# Patient Record
Sex: Male | Born: 1970
Health system: Southern US, Community
[De-identification: ages and names within clinical notes are randomized; demographics above are authoritative.]

## PROBLEM LIST (undated history)

## (undated) DIAGNOSIS — E785 Hyperlipidemia, unspecified: Secondary | ICD-10-CM

## (undated) DIAGNOSIS — E7800 Pure hypercholesterolemia, unspecified: Secondary | ICD-10-CM

## (undated) DIAGNOSIS — I10 Essential (primary) hypertension: Secondary | ICD-10-CM

## (undated) DIAGNOSIS — E119 Type 2 diabetes mellitus without complications: Secondary | ICD-10-CM

## (undated) HISTORY — DX: Hyperlipidemia, unspecified: E78.5

## (undated) HISTORY — DX: Type 2 diabetes mellitus without complications: E11.9

## (undated) HISTORY — PX: WISDOM TOOTH EXTRACTION: SHX21

---

## 2013-12-31 DIAGNOSIS — Z289 Immunization not carried out for unspecified reason: Secondary | ICD-10-CM | POA: Insufficient documentation

## 2013-12-31 DIAGNOSIS — E119 Type 2 diabetes mellitus without complications: Secondary | ICD-10-CM | POA: Insufficient documentation

## 2014-03-31 DIAGNOSIS — N529 Male erectile dysfunction, unspecified: Secondary | ICD-10-CM | POA: Insufficient documentation

## 2014-03-31 DIAGNOSIS — E3452 Partial androgen insensitivity syndrome: Secondary | ICD-10-CM | POA: Insufficient documentation

## 2014-03-31 DIAGNOSIS — Z79899 Other long term (current) drug therapy: Secondary | ICD-10-CM | POA: Insufficient documentation

## 2014-03-31 DIAGNOSIS — Z0000 Encounter for general adult medical examination without abnormal findings: Secondary | ICD-10-CM | POA: Insufficient documentation

## 2014-04-03 DIAGNOSIS — R6882 Decreased libido: Secondary | ICD-10-CM | POA: Insufficient documentation

## 2014-07-06 DIAGNOSIS — I861 Scrotal varices: Secondary | ICD-10-CM

## 2014-07-06 HISTORY — DX: Scrotal varices: I86.1

## 2015-02-11 LAB — HM HIV SCREENING LAB: HM HIV Screening: NEGATIVE

## 2016-08-03 DIAGNOSIS — Z2821 Immunization not carried out because of patient refusal: Secondary | ICD-10-CM | POA: Insufficient documentation

## 2018-08-29 ENCOUNTER — Encounter: Payer: Self-pay | Admitting: Family Medicine

## 2018-08-29 ENCOUNTER — Ambulatory Visit: Payer: 59 | Admitting: Family Medicine

## 2018-08-29 VITALS — BP 122/88 | HR 86 | Temp 98.2°F | Ht 72.5 in | Wt 206.0 lb

## 2018-08-29 DIAGNOSIS — M545 Low back pain: Secondary | ICD-10-CM

## 2018-08-29 DIAGNOSIS — E119 Type 2 diabetes mellitus without complications: Secondary | ICD-10-CM | POA: Diagnosis not present

## 2018-08-29 DIAGNOSIS — G8929 Other chronic pain: Secondary | ICD-10-CM | POA: Diagnosis not present

## 2018-08-29 LAB — HM HIV SCREENING LAB: HM HIV Screening: NEGATIVE

## 2018-08-29 MED ORDER — METFORMIN HCL 1000 MG PO TABS: 1000.0000 mg | ORAL_TABLET | Freq: Two times a day (BID) | ORAL | 3 refills | Status: DC

## 2018-08-29 MED ORDER — SITAGLIPTIN PHOSPHATE 50 MG PO TABS: 50.0000 mg | ORAL_TABLET | Freq: Every day | ORAL | 3 refills | Status: DC

## 2018-08-29 NOTE — Progress Notes (Signed)
Subjective:    Patient ID: Spencer Barber, male    DOB: 1971/10/08, 47 y.o.   MRN: [id number]  HPI   Patient presents to clinic to establish primary care with new PCP.  He recently moved to [place] from the [place] area.  He is a diabetic, had been taking Janumet XR however his insurance will no longer refill this. He also is taking Jardiance, but thinks this medication makes him urinate too much - when he does not take, he urinates normally. His most recent A1c in June 2019 was 6.5%.  Patient checks his blood sugar usually once per day, tends to run no higher than 150.   Also c/o low back aching by the end of the day. 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 He also does a lot of work outdoors at home. He states by the end of his day he is tired due to aching and has to sit or lay down.   Past Medical History:  Diagnosis Date  . Diabetes mellitus without complication (HCC)    Social History   Tobacco Use  . Smoking status: Never Smoker  . Smokeless tobacco: Never Used  Substance Use Topics  . Alcohol use: Yes   History reviewed. No pertinent surgical history.   Family History  Problem Relation Age of Onset  . Diabetes Mother   . Alcohol abuse Father    Review of Systems  Constitutional: Negative for chills, fatigue and fever.  HENT: Negative for congestion, ear pain, sinus pain and sore throat.   Eyes: Negative.   Respiratory: Negative for cough, shortness of breath and wheezing.   Cardiovascular: Negative for chest pain, palpitations and leg swelling.  Gastrointestinal: Negative for abdominal pain, diarrhea, nausea and vomiting.  Genitourinary: Negative for dysuria, frequency and urgency.  Musculoskeletal: Negative for arthralgias and myalgias.  Skin: Negative for color change, pallor and rash.  Neurological: Negative for syncope, light-headedness and headaches.  Psychiatric/Behavioral: The  patient is not nervous/anxious.       Objective:   Physical Exam  Constitutional: He appears well-developed and well-nourished. No distress.  HENT:  Head: Normocephalic and atraumatic.  Eyes: Pupils are equal, round, and reactive to light. Conjunctivae and EOM are normal. No scleral icterus.  Neck: Normal range of motion. Neck supple. No tracheal deviation present.  Cardiovascular: Normal rate, regular rhythm and normal heart sounds.  Pulmonary/Chest: Effort normal and breath sounds normal. No respiratory distress. He has no wheezes. He has no rales.  Abdominal: Soft. Bowel sounds are normal. There is no tenderness.  Neurological: He is alert and oriented to person, place, and time.  Musculoskeletal: ROM all extremities & spine intact. Gait normal. Skin: Skin is warm and dry. He is not diaphoretic. No pallor.  Psychiatric: He has a normal mood and affect. His behavior is normal. Thought content normal.   Nursing note and vitals reviewed.    Vitals:   08/29/18 1557  BP: 122/88  Pulse: 86  Temp: 98.2 F (36.8 C)  SpO2: 97%   Assessment & Plan:    Type 2 diabetes - we will break up patient's Janumet combination medicine into Januvia 50 mg once daily and metformin 1000 mg twice daily, the shorter release versions of these medications should be covered by insurance and be less expensive than the Janumet XR combination.  Patient feels that [place] Pepper is making him urinate too much, and prefer not to take it.  Patient will stop Jardiance and take only metformin and Januvia.  We will check his A1c in December 2019, he has been doing A1c's every 6 months so we will keep him on the schedule.  If his A1c remains stable in December which is the metformin and Januvia he will continue to not take Jardiance.  Low back pain - suspect pain in the low back is related to osteoarthritis.  Patient declines x-ray at this time.  Advised to try taking the Aleve to 20 mg 1-2 times per day to help improve  pain.  Lab work to be drawn in December 2019.  If labs remain stable and A1c is unchanged we will continue his current medication regimen and plan to have him follow-up in June 2020 so he can stay on his every six-month schedule as he has been doing at his past PCP.

## 2018-08-30 ENCOUNTER — Encounter: Payer: Self-pay | Admitting: Family Medicine

## 2018-08-30 DIAGNOSIS — M545 Low back pain, unspecified: Secondary | ICD-10-CM | POA: Insufficient documentation

## 2018-08-30 DIAGNOSIS — E119 Type 2 diabetes mellitus without complications: Secondary | ICD-10-CM | POA: Insufficient documentation

## 2018-08-30 DIAGNOSIS — G8929 Other chronic pain: Secondary | ICD-10-CM | POA: Insufficient documentation

## 2018-09-02 ENCOUNTER — Telehealth: Payer: Self-pay

## 2018-09-02 NOTE — Telephone Encounter (Signed)
Copied from CRM [id number]. Topic: Quick Communication - See Telephone Encounter >> Sep 02, 2018  4:45 PM Herby Abraham C wrote: CRM for notification. See Telephone encounter for: 09/02/18.  Pt came in and was seen. Pt was suppose to receive a 90 day supply on both medications prescribed. Pt says that he only received partial Rx for metFORMIN (GLUCOPHAGE) 1000 MG tablet and didn't receive Rx for sitaGLIPtin (JANUVIA) 50 MG tablet at all. Pt would like assistance with this further.

## 2018-09-03 NOTE — Telephone Encounter (Signed)
Called Pt to confirm with him that the Rx for Metformin 180 tabs was sent to [facility] also the Januvia 90 tabs was sent also. Told the Pt that he should ask the pharmacy why they didn't give him all of his Rx.

## 2018-09-05 ENCOUNTER — Other Ambulatory Visit: Payer: Self-pay | Admitting: Family Medicine

## 2018-09-05 DIAGNOSIS — E119 Type 2 diabetes mellitus without complications: Secondary | ICD-10-CM

## 2018-09-05 MED ORDER — LINAGLIPTIN 5 MG PO TABS: 5.0000 mg | ORAL_TABLET | Freq: Every day | ORAL | 3 refills | Status: DC

## 2018-09-06 NOTE — Progress Notes (Signed)
Called Pt to tell him of the Rx sent to the pharmacy for him.

## 2018-11-04 ENCOUNTER — Other Ambulatory Visit (INDEPENDENT_AMBULATORY_CARE_PROVIDER_SITE_OTHER): Payer: 59

## 2018-11-04 DIAGNOSIS — E119 Type 2 diabetes mellitus without complications: Secondary | ICD-10-CM

## 2018-11-05 ENCOUNTER — Other Ambulatory Visit: Payer: 59

## 2018-11-05 DIAGNOSIS — E119 Type 2 diabetes mellitus without complications: Secondary | ICD-10-CM

## 2018-11-05 LAB — MICROALBUMIN / CREATININE URINE RATIO
CREATININE, U: 60.8 mg/dL
Microalb Creat Ratio: 1.2 mg/g (ref 0.0–30.0)

## 2018-11-05 LAB — COMPREHENSIVE METABOLIC PANEL
ALK PHOS: 152 U/L — AB (ref 39–117)
ALT: 17 U/L (ref 0–53)
AST: 16 U/L (ref 0–37)
Albumin: 4.3 g/dL (ref 3.5–5.2)
BILIRUBIN TOTAL: 0.5 mg/dL (ref 0.2–1.2)
BUN: 12 mg/dL (ref 6–23)
CO2: 29 mEq/L (ref 19–32)
CREATININE: 1.05 mg/dL (ref 0.40–1.50)
Calcium: 10 mg/dL (ref 8.4–10.5)
Chloride: 98 mEq/L (ref 96–112)
GFR: 80.1 mL/min (ref >60.00)
GLUCOSE: 344 mg/dL — AB (ref 70–99)
Potassium: 4.2 mEq/L (ref 3.5–5.1)
Sodium: 134 mEq/L — ABNORMAL LOW (ref 135–145)
TOTAL PROTEIN: 7.1 g/dL (ref 6.0–8.3)

## 2018-11-05 LAB — CBC
HCT: 42.6 % (ref 39.0–52.0)
Hemoglobin: 14.4 g/dL (ref 13.0–17.0)
MCHC: 33.8 g/dL (ref 30.0–36.0)
MCV: 84.9 fl (ref 78.0–100.0)
PLATELETS: 202 10*3/uL (ref 150.0–400.0)
RBC: 5.02 Mil/uL (ref 4.22–5.81)
RDW: 13.9 % (ref 11.5–15.5)
WBC: 5 10*3/uL (ref 4.0–10.5)

## 2018-11-06 LAB — HEMOGLOBIN A1C
HEMOGLOBIN A1C: 10.8 %{Hb} — AB (ref <5.7)
Mean Plasma Glucose: 263 (calc)
eAG (mmol/L): 14.6 (calc)

## 2018-11-07 ENCOUNTER — Encounter: Payer: Self-pay | Admitting: Family Medicine

## 2018-11-07 DIAGNOSIS — E119 Type 2 diabetes mellitus without complications: Secondary | ICD-10-CM

## 2018-11-12 ENCOUNTER — Other Ambulatory Visit: Payer: 59

## 2018-11-12 ENCOUNTER — Ambulatory Visit: Payer: 59 | Admitting: Family Medicine

## 2018-11-12 MED ORDER — DULAGLUTIDE 0.75 MG/0.5ML ~~LOC~~ SOAJ: 0.7500 mg | SUBCUTANEOUS | 5 refills | Status: DC

## 2019-06-01 ENCOUNTER — Other Ambulatory Visit: Payer: Self-pay | Admitting: Family Medicine

## 2019-06-01 DIAGNOSIS — E119 Type 2 diabetes mellitus without complications: Secondary | ICD-10-CM

## 2019-07-01 ENCOUNTER — Other Ambulatory Visit: Payer: Self-pay | Admitting: Lab

## 2019-07-08 ENCOUNTER — Encounter: Payer: Self-pay | Admitting: Family Medicine

## 2019-07-08 DIAGNOSIS — E119 Type 2 diabetes mellitus without complications: Secondary | ICD-10-CM

## 2019-07-09 MED ORDER — METFORMIN HCL 1000 MG PO TABS: 1000.0000 mg | ORAL_TABLET | Freq: Two times a day (BID) | ORAL | 0 refills | Status: DC

## 2019-07-09 MED ORDER — LINAGLIPTIN 5 MG PO TABS: 5.0000 mg | ORAL_TABLET | Freq: Every day | ORAL | 0 refills | Status: DC

## 2019-07-09 NOTE — Telephone Encounter (Signed)
1 month supply of the metformin and Tradjenta sent into the pharmacy.  Patient really should be following up every 6 months at minimum due to his diabetes; we cannot effectively manage him without proper follow-up to be sure he is on the proper medications and to help [place] best monitor his blood work so we can be sure all electrolytes, liver, kidney functions are remaining stable.

## 2019-07-10 ENCOUNTER — Encounter: Payer: Self-pay | Admitting: Family Medicine

## 2019-07-10 ENCOUNTER — Other Ambulatory Visit: Payer: Self-pay

## 2019-07-10 ENCOUNTER — Ambulatory Visit (INDEPENDENT_AMBULATORY_CARE_PROVIDER_SITE_OTHER): Payer: 59 | Admitting: Family Medicine

## 2019-07-10 VITALS — Ht 72.5 in | Wt 206.0 lb

## 2019-07-10 DIAGNOSIS — E119 Type 2 diabetes mellitus without complications: Secondary | ICD-10-CM | POA: Diagnosis not present

## 2019-07-10 NOTE — Progress Notes (Signed)
Patient ID: Spencer AustinCamara Duty, male   DOB: [birth year], 48 y.o.   MRN: [id number]    Virtual Visit via phone Note  This visit type was conducted due to national recommendations for restrictions regarding the COVID-19 pandemic (e.g. social distancing).  This format is felt to be most appropriate for this patient at this time.  All issues noted in this document were discussed and addressed.  No physical exam was performed (except for noted visual exam findings with Video Visits).   I connected with Spencer Barber today at  4:00 PM EDT by a video enabled telemedicine application or telephone and verified that I am speaking with the correct person using two identifiers. Location patient: home Location provider: work or home office Persons participating in the virtual visit: patient, provider  I discussed the limitations, risks, security and privacy concerns of performing an evaluation and management service by telephone and the availability of in person appointments. I also discussed with the patient that there may be a patient responsible charge related to this service. The patient expressed understanding and agreed to proceed.     HPI:  Patient and I connected via phone to follow up on type 2 diabetes.  He has not followed up recently and last A1c is from December 2019  Lab Results  Component Value Date   HGBA1C 10.8 (H) 11/05/2018   Patient has been taking metformin, Jardiance and Trulicity.  Seems to be tolerating these medicines without any adverse effects.  Denies nausea, vomiting, diarrhea or any feelings of hypoglycemia.  States he has not checked his blood sugar recently due to being busy at home with his family, have been drawn on hour to get to his job.  Patient does have a glucose monitor at home.  Denies chest pain, shortness of breath, cough, palpitations, lower extremity swelling.  Denies fever or chills.   ROS: See pertinent positives and negatives per HPI.  Past Medical  History:  Diagnosis Date  . Diabetes mellitus without complication (HCC)     No past surgical history on file.  Family History  Problem Relation Age of Onset  . Diabetes Mother   . Alcohol abuse Father    Social History   Tobacco Use  . Smoking status: Never Smoker  . Smokeless tobacco: Never Used  Substance Use Topics  . Alcohol use: Yes    Current Outpatient Medications:  .  linagliptin (TRADJENTA) 5 MG TABS tablet, Take 1 tablet (5 mg total) by mouth daily., Disp: 30 tablet, Rfl: 0 .  metFORMIN (GLUCOPHAGE) 1000 MG tablet, Take 1 tablet (1,000 mg total) by mouth 2 (two) times daily with a meal., Disp: 60 tablet, Rfl: 0 .  TRULICITY 0.75 MG/0.5ML SOPN, INJECT 0.75 MG SUBCUTANEOUSLY ONCE A WEEK, Disp: 4 mL, Rfl: 0  EXAM:  GENERAL: alert, oriented, sounds well and in no acute distress  LUNGS: Speaking in full sentences, no signs of respiratory distress, breathing rate appears normal, no obvious gross SOB, gasping or wheezing  PSYCH/NEURO: pleasant and cooperative, no obvious depression or anxiety, speech and thought processing grossly intact  ASSESSMENT AND PLAN:  Discussed the following assessment and plan:  Type 2 diabetes-long discussion with patient regards to importance of checking blood sugars at least a few times per week so we have a baseline and know where sugar readings are standing.  Advised that diabetes can cause multiple chronic medical issues over time including vascular disease, increased risk for cancers, increased risk for stroke and heart attack, and even death.  Advised patient that oftentimes our blood sugars can be running higher and we would never have any symptoms, that is why we must check blood sugar so we know where her reading is.  Patient is agreeable to get blood work, will be unable to come into office to get blood work due to his work schedule however patient agrees to go to the hospital outpatient lab to have his blood work drawn.  Lab orders  placed.  Also discussed with patient that he really needs to be getting lab work and having a visit at minimum every 6 months for continued monitoring of his diabetes, we cannot successfully treat and monitor his diabetes without proper lab work and proper follow-up.  For now he will continue metformin, Jardiance and Trulicity and we will determine if any medication changes are needed based on lab work.   I discussed the assessment and treatment plan with the patient. The patient was provided an opportunity to ask questions and all were answered. The patient agreed with the plan and demonstrated an understanding of the instructions.   The patient was advised to call back or seek an in-person evaluation if the symptoms worsen or if the condition fails to improve as anticipated.  I provided 25 minutes of non-face-to-face time during this encounter.   Jodelle Green, FNP

## 2019-07-21 ENCOUNTER — Emergency Department
Admission: EM | Admit: 2019-07-21 | Discharge: 2019-07-21 | Disposition: A | Discharge status: Home / Self Care | Payer: 59

## 2019-07-21 ENCOUNTER — Other Ambulatory Visit
Admission: RE | Admit: 2019-07-21 | Discharge: 2019-07-21 | Disposition: A | Discharge status: Home / Self Care | Payer: 59 | Source: Ambulatory Visit | Attending: Family Medicine | Admitting: Family Medicine

## 2019-07-21 DIAGNOSIS — E119 Type 2 diabetes mellitus without complications: Secondary | ICD-10-CM | POA: Diagnosis present

## 2019-07-21 LAB — COMPREHENSIVE METABOLIC PANEL
ALT: 20 U/L (ref 0–44)
AST: 21 U/L (ref 15–41)
Albumin: 4.5 g/dL (ref 3.5–5.0)
Alkaline Phosphatase: 79 U/L (ref 38–126)
Anion gap: 9 (ref 5–15)
BUN: 18 mg/dL (ref 6–20)
CO2: 26 mmol/L (ref 22–32)
Calcium: 9.8 mg/dL (ref 8.9–10.3)
Chloride: 104 mmol/L (ref 98–111)
Creatinine, Ser: 0.89 mg/dL (ref 0.61–1.24)
GFR calc Af Amer: >60 mL/min (ref >60)
GFR calc non Af Amer: >60 mL/min (ref >60)
Glucose, Bld: 164 mg/dL — ABNORMAL HIGH (ref 70–99)
Potassium: 4.1 mmol/L (ref 3.5–5.1)
Sodium: 139 mmol/L (ref 135–145)
Total Bilirubin: 0.8 mg/dL (ref 0.3–1.2)
Total Protein: 7.3 g/dL (ref 6.5–8.1)

## 2019-07-21 LAB — CBC WITH DIFFERENTIAL/PLATELET
Abs Immature Granulocytes: 0.02 10*3/uL (ref 0.00–0.07)
Basophils Absolute: 0 10*3/uL (ref 0.0–0.1)
Basophils Relative: 1 %
Eosinophils Absolute: 0.1 10*3/uL (ref 0.0–0.5)
Eosinophils Relative: 1 %
HCT: 40.2 % (ref 39.0–52.0)
Hemoglobin: 13.8 g/dL (ref 13.0–17.0)
Immature Granulocytes: 1 %
Lymphocytes Relative: 35 %
Lymphs Abs: 1.5 10*3/uL (ref 0.7–4.0)
MCH: 28.4 pg (ref 26.0–34.0)
MCHC: 34.3 g/dL (ref 30.0–36.0)
MCV: 82.7 fL (ref 80.0–100.0)
Monocytes Absolute: 0.6 10*3/uL (ref 0.1–1.0)
Monocytes Relative: 13 %
Neutro Abs: 2.2 10*3/uL (ref 1.7–7.7)
Neutrophils Relative %: 49 %
Platelets: 198 10*3/uL (ref 150–400)
RBC: 4.86 MIL/uL (ref 4.22–5.81)
RDW: 12.4 % (ref 11.5–15.5)
WBC: 4.4 10*3/uL (ref 4.0–10.5)
nRBC: 0 % (ref 0.0–0.2)

## 2019-07-21 LAB — LIPID PANEL
Cholesterol: 176 mg/dL (ref 0–200)
HDL: 54 mg/dL (ref >40)
LDL Cholesterol: 99 mg/dL (ref 0–99)
Total CHOL/HDL Ratio: 3.3 RATIO
Triglycerides: 113 mg/dL (ref <150)
VLDL: 23 mg/dL (ref 0–40)

## 2019-07-21 LAB — TSH: TSH: 0.365 u[IU]/mL (ref 0.350–4.500)

## 2019-07-21 NOTE — ED Notes (Signed)
Pt here only for outpatient labs.  Not for ED visit.

## 2019-07-23 LAB — HEMOGLOBIN A1C
Hgb A1c MFr Bld: 7.5 % — ABNORMAL HIGH (ref 4.8–5.6)
Mean Plasma Glucose: 169 mg/dL

## 2019-08-20 ENCOUNTER — Other Ambulatory Visit: Payer: Self-pay | Admitting: Family Medicine

## 2019-08-20 ENCOUNTER — Encounter: Payer: Self-pay | Admitting: Family Medicine

## 2019-08-20 DIAGNOSIS — E119 Type 2 diabetes mellitus without complications: Secondary | ICD-10-CM

## 2019-08-20 MED ORDER — LINAGLIPTIN 5 MG PO TABS: 5.0000 mg | ORAL_TABLET | Freq: Every day | ORAL | 0 refills | Status: DC

## 2019-08-20 MED ORDER — METFORMIN HCL 1000 MG PO TABS: 1000.0000 mg | ORAL_TABLET | Freq: Two times a day (BID) | ORAL | 0 refills | Status: DC

## 2019-08-20 NOTE — Telephone Encounter (Signed)
Lauren, see messages. He seems to be having an issue with medication refills. I resumed his metformin and refilled his tradjenta. He is to see you in December for diabetes follow up, wasn't sure if you wanted to resume his Trulicity now or wait until then.  Allie Bossier, NP-C

## 2019-08-21 MED ORDER — TRULICITY 0.75 MG/0.5ML ~~LOC~~ SOAJ: SUBCUTANEOUS | 0 refills | Status: DC

## 2019-08-25 ENCOUNTER — Telehealth: Payer: Self-pay

## 2019-08-25 ENCOUNTER — Other Ambulatory Visit: Payer: Self-pay | Admitting: Family Medicine

## 2019-08-25 DIAGNOSIS — E119 Type 2 diabetes mellitus without complications: Secondary | ICD-10-CM

## 2019-08-25 MED ORDER — TRULICITY 0.75 MG/0.5ML ~~LOC~~ SOAJ: SUBCUTANEOUS | 2 refills | Status: DC

## 2019-08-25 NOTE — Telephone Encounter (Signed)
Copied from [place] [id number]. Topic: General - Other >> Aug 25, 2019  8:42 AM Jodie Echevaria wrote: Reason for CRM: [facility] called to say that patient insurance require prior authorization for her to get Dulaglutide (TRULICITY) 3.64 WO/0.3OZ SOPN paperwork sent over from Pharmacy needed ASAP please. Please advise

## 2019-08-25 NOTE — Telephone Encounter (Signed)
Please let patient know trulicity sent into pharmacy  Thanks  LG

## 2019-08-25 NOTE — Telephone Encounter (Signed)
Called Pt he was at work, I spoke to Pt wife and told her the Pt Rx was sent into [facility].

## 2019-09-18 ENCOUNTER — Telehealth: Payer: Self-pay

## 2019-09-18 NOTE — Telephone Encounter (Signed)
Copied from [place] [id number]. Topic: Quick Sport and exercise psychologist Patient (Clinic Use ONLY) >> Sep 18, 2019  4:32 PM Pauline Good wrote: Reason for CRM: pt was very upset because his appt was being move and he stated he work out of town and he won't get off early to come to a doctors appt. Pt need something around 4:30, tried to give pt appt but he declined them all. Ptease call pt to sched for medication refill

## 2019-09-19 NOTE — Telephone Encounter (Signed)
Called and scheduled pt for 11/05/19 @4pm 

## 2019-09-19 NOTE — Telephone Encounter (Signed)
When I called and spoke to pt about rescheduling his appt. Pt stated we needed to accommodate him with a 4:30 appt time. I told him that we do not have 4:30 appt slots. He stated that we were denying his health care. I told him that I could get him in at 4pm he asked what the late policy was I told him 70HEKB after appt time. He said that he will probaly have to change appt

## 2019-10-13 ENCOUNTER — Other Ambulatory Visit: Payer: Self-pay

## 2019-10-13 DIAGNOSIS — Z20822 Contact with and (suspected) exposure to covid-19: Secondary | ICD-10-CM

## 2019-10-14 ENCOUNTER — Encounter: Payer: Self-pay | Admitting: Family

## 2019-10-14 ENCOUNTER — Telehealth: Payer: Self-pay

## 2019-10-14 LAB — NOVEL CORONAVIRUS, NAA: SARS-CoV-2, NAA: DETECTED — AB

## 2019-10-14 NOTE — Telephone Encounter (Signed)
Received call from patient checking Covid results.  Advised results are positive.  Advised to contact PCP or visit ED if symptoms worsen.   

## 2019-10-28 ENCOUNTER — Ambulatory Visit: Payer: 59 | Admitting: Family Medicine

## 2019-11-05 ENCOUNTER — Encounter: Payer: Self-pay | Admitting: Family

## 2019-11-05 ENCOUNTER — Other Ambulatory Visit: Payer: Self-pay

## 2019-11-05 ENCOUNTER — Ambulatory Visit (INDEPENDENT_AMBULATORY_CARE_PROVIDER_SITE_OTHER): Payer: 59 | Admitting: Family

## 2019-11-05 VITALS — Ht 72.5 in | Wt 206.0 lb

## 2019-11-05 DIAGNOSIS — E119 Type 2 diabetes mellitus without complications: Secondary | ICD-10-CM | POA: Diagnosis not present

## 2019-11-05 DIAGNOSIS — Z1211 Encounter for screening for malignant neoplasm of colon: Secondary | ICD-10-CM

## 2019-11-05 NOTE — Assessment & Plan Note (Signed)
Largely controlled. Pending a1c.  Advised pneumococcal 23 vaccine at local pharmacy due work schedule ( he needs late appointment).

## 2019-11-05 NOTE — Progress Notes (Signed)
Virtual Visit via Video Note  I connected with@  on 11/05/19 at  4:00 PM EST by a video enabled telemedicine application and verified that I am speaking with the correct person using two identifiers.  Location patient: home Location provider:work  Persons participating in the virtual visit: patient, provider  I discussed the limitations of evaluation and management by telemedicine and the availability of in person appointments. The patient expressed understanding and agreed to proceed.   HPI: Establish care Feels well today.  Primary concern is getting up-to-date on preventive care when having labs drawn. He is interested in having a  colonoscopy. Great-grandmother he believes had colon cancer. He would like to screen for prostate cancer with PSA.  He denies any urinary hesitancy, decreased flow.  DM- Last a1c 7.6; compliant with medicaition  Due for Tdap and pneumonia.   ROS: See pertinent positives and negatives per HPI.  Past Medical History:  Diagnosis Date  . Diabetes mellitus without complication ([place])     History reviewed. No pertinent surgical history.  Family History  Problem Relation Age of Onset  . Diabetes Mother   . Alcohol abuse Father   . Colon cancer Maternal Great-grandmother     SOCIAL HX: never smoker   Current Outpatient Medications:  .  Dulaglutide (TRULICITY) 6.76 PP/5.0DT SOPN, Inject 0.75 mg subcutaneously once a week., Disp: 4 mL, Rfl: 2 .  linagliptin (TRADJENTA) 5 MG TABS tablet, Take 1 tablet (5 mg total) by mouth daily. For diabetes., Disp: 90 tablet, Rfl: 0 .  metFORMIN (GLUCOPHAGE) 1000 MG tablet, Take 1 tablet (1,000 mg total) by mouth 2 (two) times daily with a meal. For diabetes, Disp: 180 tablet, Rfl: 0  EXAM:  VITALS per patient if applicable:  GENERAL: alert, oriented, appears well and in no acute distress  HEENT: atraumatic, conjunttiva clear, no obvious abnormalities on inspection of external nose and ears  NECK: normal  movements of the head and neck  LUNGS: on inspection no signs of respiratory distress, breathing rate appears normal, no obvious gross SOB, gasping or wheezing  CV: no obvious cyanosis  MS: moves all visible extremities without noticeable abnormality  PSYCH/NEURO: pleasant and cooperative, no obvious depression or anxiety, speech and thought processing grossly intact  ASSESSMENT AND PLAN:  Discussed the following assessment and plan:  Type 2 diabetes mellitus without complication, without long-term current use of insulin (HCC) - Plan: Hemoglobin A1c, Lipid panel-future, VITAMIN D 25 Hydroxy (Vit-D Deficiency, Fractures), PSA-PSA-  AA start at 3, White 49 yo, Microalbumin / creatinine urine ratio  Screen for colon cancer - Plan: Colonoscopy-Ambulatory referral to Gastroenterology-45 to 75 years Problem List Items Addressed This Visit      Endocrine   Type 2 diabetes mellitus without complication, without long-term current use of insulin ([place]) - Primary    Largely controlled. Pending a1c.  Advised pneumococcal 23 vaccine at local pharmacy due work schedule ( he needs late appointment).      Relevant Orders   Hemoglobin A1c   Lipid panel-future   VITAMIN D 25 Hydroxy (Vit-D Deficiency, Fractures)   PSA-PSA-  AA start at 49, White 48 yo   Microalbumin / creatinine urine ratio    Other Visit Diagnoses    Screen for colon cancer       Relevant Orders   Colonoscopy-Ambulatory referral to Gastroenterology-45 to 75 years      -we discussed possible serious and likely etiologies, options for evaluation and workup, limitations of telemedicine visit vs in person visit, treatment,  treatment risks and precautions. Pt prefers to treat via telemedicine empirically rather then risking or undertaking an in person visit at this moment. Patient agrees to seek prompt in person care if worsening, new symptoms arise, or if is not improving with treatment.   I discussed the assessment and treatment  plan with the patient. The patient was provided an opportunity to ask questions and all were answered. The patient agreed with the plan and demonstrated an understanding of the instructions.   The patient was advised to call back or seek an in-person evaluation if the symptoms worsen or if the condition fails to improve as anticipated.   Rennie Plowman, FNP

## 2019-11-06 ENCOUNTER — Telehealth: Payer: Self-pay | Admitting: Family Medicine

## 2019-11-06 NOTE — Telephone Encounter (Signed)
Lm on vm to call and schedule cpe at the end of Feb or March.

## 2019-11-16 ENCOUNTER — Other Ambulatory Visit
Admission: RE | Admit: 2019-11-16 | Discharge: 2019-11-16 | Disposition: A | Discharge status: Home / Self Care | Payer: 59 | Attending: Family | Admitting: Family

## 2019-11-16 DIAGNOSIS — E119 Type 2 diabetes mellitus without complications: Secondary | ICD-10-CM | POA: Diagnosis not present

## 2019-11-16 LAB — LIPID PANEL
Cholesterol: 160 mg/dL (ref 0–200)
HDL: 42 mg/dL (ref >40)
LDL Cholesterol: 100 mg/dL — ABNORMAL HIGH (ref 0–99)
Total CHOL/HDL Ratio: 3.8 RATIO
Triglycerides: 92 mg/dL (ref <150)
VLDL: 18 mg/dL (ref 0–40)

## 2019-11-16 LAB — VITAMIN D 25 HYDROXY (VIT D DEFICIENCY, FRACTURES): Vit D, 25-Hydroxy: 67.16 ng/mL (ref 30–100)

## 2019-11-16 LAB — PSA: Prostatic Specific Antigen: 0.76 ng/mL (ref 0.00–4.00)

## 2019-11-16 LAB — HEMOGLOBIN A1C
Hgb A1c MFr Bld: 6.4 % — ABNORMAL HIGH (ref 4.8–5.6)
Mean Plasma Glucose: 136.98 mg/dL

## 2019-11-17 LAB — MICROALBUMIN / CREATININE URINE RATIO
Creatinine, Urine: 81.7 mg/dL
Microalb Creat Ratio: 20 mg/g creat (ref 0–29)
Microalb, Ur: 16.1 ug/mL — ABNORMAL HIGH

## 2019-11-19 ENCOUNTER — Other Ambulatory Visit: Payer: Self-pay

## 2019-11-19 DIAGNOSIS — E119 Type 2 diabetes mellitus without complications: Secondary | ICD-10-CM

## 2019-11-19 MED ORDER — METFORMIN HCL 1000 MG PO TABS: 1000.0000 mg | ORAL_TABLET | Freq: Two times a day (BID) | ORAL | 0 refills | Status: DC

## 2019-11-19 MED ORDER — ROSUVASTATIN CALCIUM 10 MG PO TABS: 10.0000 mg | ORAL_TABLET | Freq: Every day | ORAL | 3 refills | Status: DC

## 2019-11-21 ENCOUNTER — Encounter: Payer: Self-pay | Admitting: Family

## 2019-11-21 ENCOUNTER — Other Ambulatory Visit: Payer: Self-pay | Admitting: Family

## 2019-11-21 ENCOUNTER — Encounter: Payer: Self-pay | Admitting: *Deleted

## 2019-11-21 DIAGNOSIS — E119 Type 2 diabetes mellitus without complications: Secondary | ICD-10-CM

## 2019-11-21 DIAGNOSIS — R809 Proteinuria, unspecified: Secondary | ICD-10-CM

## 2019-11-21 MED ORDER — LISINOPRIL 5 MG PO TABS: 2.5000 mg | ORAL_TABLET | Freq: Every day | ORAL | 3 refills | Status: DC

## 2019-11-24 ENCOUNTER — Telehealth: Payer: Self-pay | Admitting: Family

## 2019-11-24 NOTE — Chronic Care Management (AMB) (Signed)
  Care Management   Outreach Note  11/24/2019 Name: Spencer Barber MRN: [id number] DOB: April 26, 1971  Referred by: Allegra Grana, FNP Reason for referral :  Care Management (CM Initial outreach unsuccessful)   An unsuccessful telephone outreach was attempted today. The patient was referred to the case management team by for assistance with care management and care coordination.   Follow Up Plan: A HIPPA compliant phone message was left for the patient providing contact information and requesting a return call.  The care management team will reach out to the patient again over the next 7 days.  If patient returns call to provider office, please advise to call Embedded Care Management Care Guide Elisha Ponder LPN at [phone]  Elisha Ponder, LPN Health Advisor, Embedded Care Coordination [facility] ??[email or web address] ??[phone]

## 2019-11-24 NOTE — Chronic Care Management (AMB) (Signed)
  Care Management   Note  11/24/2019 Name: Spencer Barber MRN: [id number] DOB: 06/23/1971  Spencer Barber is a 49 y.o. year old male who is a primary care patient of Allegra Grana, FNP. I reached out to Darcus Austin by phone today in response to a referral sent by Spencer Barber's health plan.    Spencer Barber was given information about care management services today including:  1. Care management services include personalized support from designated clinical staff supervised by his physician, including individualized plan of care and coordination with other care providers 2. 24/7 contact phone numbers for assistance for urgent and routine care needs. 3. The patient may stop care management services at any time by phone call to the office staff.  Patient agreed to services and verbal consent obtained.   Follow up plan: Telephone appointment with care management team member scheduled for:01/01/2020  Spencer Ponder, LPN Health Advisor, Embedded Care Coordination Spencer Barber Health Care Management ??[email or web address] ??[phone]

## 2019-11-29 ENCOUNTER — Other Ambulatory Visit
Admission: RE | Admit: 2019-11-29 | Discharge: 2019-11-29 | Disposition: A | Discharge status: Home / Self Care | Payer: 59 | Source: Ambulatory Visit | Attending: Family | Admitting: Family

## 2019-11-29 ENCOUNTER — Encounter: Payer: Self-pay | Admitting: Family

## 2019-11-29 ENCOUNTER — Other Ambulatory Visit: Payer: Self-pay

## 2019-12-01 ENCOUNTER — Other Ambulatory Visit: Payer: Self-pay | Admitting: Family

## 2019-12-01 DIAGNOSIS — R899 Unspecified abnormal finding in specimens from other organs, systems and tissues: Secondary | ICD-10-CM

## 2019-12-01 DIAGNOSIS — E119 Type 2 diabetes mellitus without complications: Secondary | ICD-10-CM

## 2019-12-03 ENCOUNTER — Other Ambulatory Visit: Payer: Self-pay

## 2019-12-03 ENCOUNTER — Other Ambulatory Visit
Admission: RE | Admit: 2019-12-03 | Discharge: 2019-12-03 | Disposition: A | Discharge status: Home / Self Care | Payer: 59 | Attending: Family | Admitting: Family

## 2019-12-03 DIAGNOSIS — E119 Type 2 diabetes mellitus without complications: Secondary | ICD-10-CM | POA: Diagnosis not present

## 2019-12-03 LAB — BASIC METABOLIC PANEL
Anion gap: 15 (ref 5–15)
BUN: 13 mg/dL (ref 6–20)
CO2: 26 mmol/L (ref 22–32)
Calcium: 10.4 mg/dL — ABNORMAL HIGH (ref 8.9–10.3)
Chloride: 102 mmol/L (ref 98–111)
Creatinine, Ser: 0.95 mg/dL (ref 0.61–1.24)
GFR calc Af Amer: >60 mL/min (ref >60)
GFR calc non Af Amer: >60 mL/min (ref >60)
Glucose, Bld: 108 mg/dL — ABNORMAL HIGH (ref 70–99)
Potassium: 4.3 mmol/L (ref 3.5–5.1)
Sodium: 143 mmol/L (ref 135–145)

## 2019-12-08 NOTE — Addendum Note (Signed)
Addended by: Allegra Grana on: 12/08/2019 10:11 AM   Modules accepted: Orders

## 2020-01-01 ENCOUNTER — Telehealth: Payer: 59

## 2020-01-18 ENCOUNTER — Other Ambulatory Visit: Payer: Self-pay | Admitting: Primary Care

## 2020-01-18 DIAGNOSIS — E119 Type 2 diabetes mellitus without complications: Secondary | ICD-10-CM

## 2020-01-20 NOTE — Telephone Encounter (Signed)
Last prescribed by Spencer Reel, NP on 08/20/2019 in absence for Spencer Cover, FNP. Patient have transfer care to Spencer Plowman, FNP.  Please advise

## 2020-02-07 ENCOUNTER — Ambulatory Visit: Payer: 59 | Attending: Internal Medicine

## 2020-02-07 DIAGNOSIS — Z23 Encounter for immunization: Secondary | ICD-10-CM

## 2020-02-07 NOTE — Progress Notes (Signed)
   Covid-19 Vaccination Clinic  Name:  Juergen Hardenbrook    MRN: [id number] DOB: 10/04/1971  02/07/2020  Mr. Beckner was observed post Covid-19 immunization for 15 minutes without incident. He was provided with Vaccine Information Sheet and instruction to access the V-Safe system.   Mr. Gaughan was instructed to call 911 with any severe reactions post vaccine: Marland Kitchen Difficulty breathing  . Swelling of face and throat  . A fast heartbeat  . A bad rash all over body  . Dizziness and weakness   Immunizations Administered    Name Date Dose VIS Date Route   Pfizer COVID-19 Vaccine 02/07/2020  6:30 PM 0.3 mL 10/24/2019 Intramuscular   Manufacturer: [employer], [employer]   Lot: [id number]   NDC: [address]-8

## 2020-02-15 ENCOUNTER — Other Ambulatory Visit: Payer: Self-pay | Admitting: Primary Care

## 2020-02-15 DIAGNOSIS — E119 Type 2 diabetes mellitus without complications: Secondary | ICD-10-CM

## 2020-02-17 NOTE — Telephone Encounter (Signed)
I called patient & he was busy at work. He stated that the pneumovax 23 was not a priority right now, but would like a physical. He did not know due to work & his anniversary in May what day would work. He said that he would call or mychart back to let us know dates when he could be scheduled.

## 2020-02-17 NOTE — Telephone Encounter (Signed)
Call pt He is due for f/u sch in office so he can pneumovax 23 at that time Ensure that his visit is a month out from any covid vaccines so we can give pneumovax

## 2020-02-17 NOTE — Telephone Encounter (Signed)
This refill came to [facility] refill

## 2020-02-29 ENCOUNTER — Ambulatory Visit: Payer: 59 | Attending: Internal Medicine

## 2020-02-29 DIAGNOSIS — Z23 Encounter for immunization: Secondary | ICD-10-CM

## 2020-02-29 NOTE — Progress Notes (Signed)
   Covid-19 Vaccination Clinic  Name:  Spencer Barber    MRN: [id number] DOB: 21-Dec-1970  02/29/2020  Spencer Barber was observed post Covid-19 immunization for 15 minutes without incident. He was provided with Vaccine Information Sheet and instruction to access the V-Safe system.   Spencer Barber was instructed to call 911 with any severe reactions post vaccine: Marland Kitchen Difficulty breathing  . Swelling of face and throat  . A fast heartbeat  . A bad rash all over body  . Dizziness and weakness   Immunizations Administered    Name Date Dose VIS Date Route   Pfizer COVID-19 Vaccine 02/29/2020  3:17 PM 0.3 mL 10/24/2019 Intramuscular   Manufacturer: [employer], [employer]   Lot: [id number]   NDC: [address]-8

## 2020-03-29 ENCOUNTER — Other Ambulatory Visit: Payer: Self-pay

## 2020-03-29 DIAGNOSIS — E119 Type 2 diabetes mellitus without complications: Secondary | ICD-10-CM

## 2020-03-29 MED ORDER — TRULICITY 0.75 MG/0.5ML ~~LOC~~ SOAJ: SUBCUTANEOUS | 0 refills | Status: DC

## 2020-05-25 ENCOUNTER — Other Ambulatory Visit: Payer: Self-pay | Admitting: Family

## 2020-05-25 DIAGNOSIS — E119 Type 2 diabetes mellitus without complications: Secondary | ICD-10-CM

## 2020-06-15 ENCOUNTER — Other Ambulatory Visit: Payer: Self-pay | Admitting: Family

## 2020-06-15 DIAGNOSIS — E119 Type 2 diabetes mellitus without complications: Secondary | ICD-10-CM

## 2020-07-05 ENCOUNTER — Telehealth: Payer: Self-pay | Admitting: Family

## 2020-07-05 NOTE — Telephone Encounter (Signed)
Call pt He is due for DM f/u Please confirm he has and is taking all medications on his chart

## 2020-07-06 NOTE — Telephone Encounter (Signed)
LM asking patient to call back so I could go over meds & get him scheduled for f/u.

## 2020-07-13 ENCOUNTER — Other Ambulatory Visit: Payer: Self-pay

## 2020-07-13 ENCOUNTER — Encounter: Payer: Self-pay | Admitting: Family

## 2020-07-13 DIAGNOSIS — E119 Type 2 diabetes mellitus without complications: Secondary | ICD-10-CM

## 2020-07-13 MED ORDER — LINAGLIPTIN 5 MG PO TABS: ORAL_TABLET | ORAL | 1 refills | Status: DC

## 2020-07-21 ENCOUNTER — Other Ambulatory Visit: Payer: Self-pay | Admitting: Family

## 2020-07-21 DIAGNOSIS — E119 Type 2 diabetes mellitus without complications: Secondary | ICD-10-CM

## 2020-07-22 MED ORDER — TRULICITY 0.75 MG/0.5ML ~~LOC~~ SOAJ: SUBCUTANEOUS | 0 refills | Status: DC

## 2020-07-25 ENCOUNTER — Encounter: Payer: Self-pay | Admitting: Family

## 2020-07-26 ENCOUNTER — Other Ambulatory Visit: Payer: Self-pay

## 2020-07-26 ENCOUNTER — Telehealth: Payer: Self-pay

## 2020-07-26 NOTE — Telephone Encounter (Signed)
Please take a look at pictures & see my responses to [employer].

## 2020-07-26 NOTE — Telephone Encounter (Signed)
I spoke with patient & he is scheduled here with Fransico Setters, NP on Friday. Patient stated he had no fever, SOB, heat/warmth to leg. He stated that he was playing with his son & he felt something pull. He was in pain, but kept going. Then his wife noticed the big bruise on the back of his leg on Sunday. He was advised that since Friday was so far out that if he had any new or worsening symptoms to please go to UC or ED. I also moved physical up tp next week the 22nd.

## 2020-07-26 NOTE — Telephone Encounter (Signed)
Close  

## 2020-07-28 ENCOUNTER — Other Ambulatory Visit: Payer: Self-pay

## 2020-07-30 ENCOUNTER — Encounter: Payer: Self-pay | Admitting: Nurse Practitioner

## 2020-07-30 ENCOUNTER — Ambulatory Visit: Payer: BC Managed Care – PPO | Admitting: Nurse Practitioner

## 2020-07-30 ENCOUNTER — Other Ambulatory Visit: Payer: Self-pay

## 2020-07-30 VITALS — BP 120/78 | HR 92 | Temp 98.6°F | Ht 73.0 in | Wt 213.0 lb

## 2020-07-30 DIAGNOSIS — S76301A Unspecified injury of muscle, fascia and tendon of the posterior muscle group at thigh level, right thigh, initial encounter: Secondary | ICD-10-CM | POA: Diagnosis not present

## 2020-07-30 DIAGNOSIS — S76309A Unspecified injury of muscle, fascia and tendon of the posterior muscle group at thigh level, unspecified thigh, initial encounter: Secondary | ICD-10-CM | POA: Insufficient documentation

## 2020-07-30 NOTE — Patient Instructions (Addendum)
Suspect a hamstring injury from fast running onset 2 weeks ago.It is improving but still having pain.  May apply ice, rest and compression with an ACE wrap may be helpful, take Tylenol as directed.   Please see [facility] ASAP  and use the [facility]ALK IN for further evaluation and discussion about MRI of the thigh. They are open now and can see you now.    Hamstring Strain  A hamstring strain happens when the muscles in the back of the thighs (hamstring muscles) are overstretched or torn. The hamstring muscles are used in straightening the hips, bending the knees, and pulling back the legs. This injury is often called a pulled hamstring muscle. The tissue that connects the muscle to a bone (tendon) may also be affected. The severity of a hamstring strain may be rated in degrees or grades. First-degree (or grade 1) strains have the least amount of muscle tearing and pain. Second-degree and third-degree (grade 2 and 3) strains have increasingly more tearing and pain. What are the causes? This condition is caused by a sudden, violent force being placed on the hamstring muscles, stretching them too far. This often happens during activities that involve running, jumping, kicking, or weight lifting. What increases the risk? Hamstring strains are especially common in athletes. The following factors may also make you more likely to develop this condition:  Having low strength, endurance, or flexibility of the hamstring muscles.  Doing high-impact physical activity or sports.  Having poor physical fitness.  Having a previous leg injury.  Having tired (fatigued) muscles. What are the signs or symptoms? Symptoms of this condition include:  Pain in the back of the thigh.  Swelling.  Bruising.  Muscle spasms.  Trouble moving the affected muscle because of pain. For severe strains, you may feel popping or snapping in the back of your thigh when the injury occurs. How is this diagnosed? This  condition is diagnosed based on your symptoms, your medical history, and a physical exam. How is this treated? Treatment for this condition usually involves:  Protecting, resting, icing, applying compression, and elevating the injured area (PRICE therapy).  Medicines. Your health care provider may recommend medicines to help reduce pain or inflammation.  Doing exercises to regain strength and flexibility in the muscles. Your health care provider will tell you when it is okay to begin exercising. Follow these instructions at home: PRICE therapy Use PRICE therapy to promote muscle healing during the first 2-3 days after your injury, or as told by your health care provider.  Protect the muscle from being injured again.  Rest your injury. This usually involves limiting your normal activities and not using the injured hamstring muscle. Talk with your health care provider about how you should limit your activities.  Apply ice to the injured area: ? Put ice in a plastic bag. ? Place a towel between your skin and the bag. ? Leave the ice on for 20 minutes, 2-3 times a day. After the third day, switch to applying heat as told.  Put pressure (compression) on your injured hamstring by wrapping it with an elastic bandage. Be careful not to wrap it too tightly. That may interfere with blood circulation or may increase swelling.  Raise (elevate) your injured hamstring above the level of your heart as often as possible. When you are lying down, you can do this by putting a pillow under your thigh.  Activity  Begin exercising or stretching only as told by your health care provider.  Do not return to full activity level until your health care provider approves.  To help prevent muscle strains in the future, always warm up before exercising and stretch afterward. General instructions  Take over-the-counter and prescription medicines only as told by your health care provider.  If directed, apply  heat to the affected area as often as told by your health care provider. Use the heat source that your health care provider recommends, such as a moist heat pack or a heating pad. ? Place a towel between your skin and the heat source. ? Leave the heat on for 20-30 minutes. ? Remove the heat if your skin turns bright red. This is especially important if you are unable to feel pain, heat, or cold. You may have a greater risk of getting burned.  Keep all follow-up visits as told by your health care provider. This is important. Contact a health care provider if you have:  Increasing pain or swelling in the injured area.  Numbness, tingling, or a significant loss of strength in the injured area. Get help right away if:  Your foot or your toes become cold or turn blue. Summary  A hamstring strain happens when the muscles in the back of the thighs (hamstring muscles) are overstretched or torn.  This injury can be caused by a sudden, violent force being placed on the hamstring muscles, causing them to stretch too far.  Symptoms include pain, swelling, and muscle spasms in the injured area.  Treatment includes what is called PRICE therapy: protecting, resting, icing, applying compression, and elevating the injured area. This information is not intended to replace advice given to you by your health care provider. Make sure you discuss any questions you have with your health care provider. Document Revised: 10/12/2017 Document Reviewed: 09/27/2017 Elsevier Patient Education  2020 [employer].

## 2020-07-30 NOTE — Progress Notes (Signed)
Established Patient Office Visit  Subjective:  Patient ID: Spencer Barber, male    DOB: 1971/08/01  Age: 49 y.o. MRN: [id number]  CC:  Chief Complaint  Patient presents with  . Acute Visit    leg pain and bruising    HPI Spencer Barber presents for bruise back of leg no anticoagulants.   He was running 2 weeks ago Sun- actually racing his son and felt a pop or strong stretch in the back of his right thigh that made him fall.  He got helped up and was able to walk and it hurt and he was limping. He functioned normally  the rest of the day. The next day, he rode in the car for about 3 hours and took OTC Bayer ASA  and some type of back pain meds. He applied a heating pad and sat on it periodically  for 2-3 days. He noticed a big red spot at the posterior lower thigh on Sat, but the tenderness was in the upper thigh. Over time, he is walking better. He did try ice one night. He mainly used heat. He is sleeping well. Still uncomfortable if he puts pressure on the back of his upper leg/thigh. No pain in back, hip, knee or lower leg.     Past Medical History:  Diagnosis Date  . Diabetes mellitus without complication (HCC)     No past surgical history on file.  Family History  Problem Relation Age of Onset  . Diabetes Mother   . Alcohol abuse Father   . Colon cancer Maternal Great-grandmother     Social History   Socioeconomic History  . Marital status: Married    Spouse name: Not on file  . Number of children: Not on file  . Years of education: Not on file  . Highest education level: Not on file  Occupational History  . Not on file  Tobacco Use  . Smoking status: Never Smoker  . Smokeless tobacco: Never Used  Vaping Use  . Vaping Use: Never used  Substance and Sexual Activity  . Alcohol use: Yes  . Drug use: Never  . Sexual activity: Yes  Other Topics Concern  . Not on file  Social History Narrative  . Not on file   Social Determinants of Health   Financial  Resource Strain:   . Difficulty of Paying Living Expenses: Not on file  Food Insecurity:   . Worried About Programme researcher, broadcasting/film/video in the Last Year: Not on file  . Ran Out of Food in the Last Year: Not on file  Transportation Needs:   . Lack of Transportation (Medical): Not on file  . Lack of Transportation (Non-Medical): Not on file  Physical Activity:   . Days of Exercise per Week: Not on file  . Minutes of Exercise per Session: Not on file  Stress:   . Feeling of Stress : Not on file  Social Connections:   . Frequency of Communication with Friends and Family: Not on file  . Frequency of Social Gatherings with Friends and Family: Not on file  . Attends Religious Services: Not on file  . Active Member of Clubs or Organizations: Not on file  . Attends Banker Meetings: Not on file  . Marital Status: Not on file  Intimate Partner Violence:   . Fear of Current or Ex-Partner: Not on file  . Emotionally Abused: Not on file  . Physically Abused: Not on file  . Sexually Abused: Not on  file    Outpatient Medications Prior to Visit  Medication Sig Dispense Refill  . linagliptin (TRADJENTA) 5 MG TABS tablet TAKE 1 TABLET BY MOUTH ONCE DAILY. FOR DIABETES. 90 tablet 1  . lisinopril (ZESTRIL) 5 MG tablet Take 0.5 tablets (2.5 mg total) by mouth daily. 90 tablet 3  . metFORMIN (GLUCOPHAGE) 1000 MG tablet TAKE 1 TABLET BY MOUTH TWICE DAILY WITH A MEAL FOR DIABETES 180 tablet 0  . rosuvastatin (CRESTOR) 10 MG tablet Take 1 tablet (10 mg total) by mouth daily. 90 tablet 3  . Dulaglutide (TRULICITY) 0.75 MG/0.5ML SOPN INJECT 0.75MG  SUBCUTANEOUSLY ONCE A WEEK (Patient not taking: Reported on 07/30/2020) 4 mL 0   No facility-administered medications prior to visit.    No Known Allergies  Review of Systems  Constitutional: Negative for chills and fever.  HENT: Negative.   Cardiovascular: Negative for chest pain, palpitations and leg swelling.  Genitourinary: Negative.     Musculoskeletal:       Right posterior upper leg tenderness and bruising.   Neurological: Negative.   Hematological: Negative for adenopathy. Does not bruise/bleed easily.  Psychiatric/Behavioral: Negative.       Objective:    Physical Exam Vitals reviewed.  Constitutional:      Appearance: He is normal weight.  HENT:     Head: Normocephalic and atraumatic.  Cardiovascular:     Rate and Rhythm: Normal rate and regular rhythm.     Pulses: Normal pulses.     Heart sounds: Normal heart sounds.  Pulmonary:     Effort: Pulmonary effort is normal.     Breath sounds: Normal breath sounds.  Musculoskeletal:     Cervical back: Normal range of motion and neck supple.     Comments: Right hamstring area is tender superior to the dependent   bruising. No deformity. Normal ROM. Neg SLR, no hip pain or decrease ROM. Normal lumbar ROM. No back or hip tenderness. Normal exam RT knee, RT lower leg. Gait is with very slight limp- favoring right leg.   Skin:    Findings: Bruising present.  Neurological:     General: No focal deficit present.     Mental Status: He is alert and oriented to person, place, and time.  Psychiatric:        Mood and Affect: Mood normal.        Behavior: Behavior normal.     BP 120/78 (BP Location: Left Arm, Patient Position: Sitting, Cuff Size: Normal)   Pulse 92   Temp 98.6 F (37 C) (Oral)   Ht 6\' 1"  (1.854 m)   Wt 213 lb (96.6 kg)   SpO2 98%   BMI 28.10 kg/m  Wt Readings from Last 3 Encounters:  07/30/20 213 lb (96.6 kg)  11/05/19 206 lb (93.4 kg)  07/10/19 206 lb (93.4 kg)   Pulse Readings from Last 3 Encounters:  07/30/20 92  08/29/18 86    BP Readings from Last 3 Encounters:  07/30/20 120/78  08/29/18 122/88    Lab Results  Component Value Date   CHOL 160 11/16/2019   HDL 42 11/16/2019   LDLCALC 100 (H) 11/16/2019   TRIG 92 11/16/2019   CHOLHDL 3.8 11/16/2019      Health Maintenance Due  Topic Date Due  . Hepatitis C Screening   Never done  . PNEUMOCOCCAL POLYSACCHARIDE VACCINE AGE 48-64 HIGH RISK  Never done  . TETANUS/TDAP  04/06/2019  . FOOT EXAM  04/14/2019  . OPHTHALMOLOGY EXAM  01/12/2020  . HEMOGLOBIN  A1C  05/15/2020    There are no preventive care reminders to display for this patient.  Lab Results  Component Value Date   TSH 0.365 07/21/2019   Lab Results  Component Value Date   WBC 4.4 07/21/2019   HGB 13.8 07/21/2019   HCT 40.2 07/21/2019   MCV 82.7 07/21/2019   PLT 198 07/21/2019   Lab Results  Component Value Date   NA 143 12/03/2019   K 4.3 12/03/2019   CO2 26 12/03/2019   GLUCOSE 108 (H) 12/03/2019   BUN 13 12/03/2019   CREATININE 0.95 12/03/2019   BILITOT 0.8 07/21/2019   ALKPHOS 79 07/21/2019   AST 21 07/21/2019   ALT 20 07/21/2019   PROT 7.3 07/21/2019   ALBUMIN 4.5 07/21/2019   CALCIUM 10.4 (H) 12/03/2019   ANIONGAP 15 12/03/2019   GFR 80.10 11/04/2018   Lab Results  Component Value Date   CHOL 160 11/16/2019   Lab Results  Component Value Date   HDL 42 11/16/2019   Lab Results  Component Value Date   LDLCALC 100 (H) 11/16/2019   Lab Results  Component Value Date   TRIG 92 11/16/2019   Lab Results  Component Value Date   CHOLHDL 3.8 11/16/2019   Lab Results  Component Value Date   HGBA1C 6.4 (H) 11/16/2019      Assessment & Plan:   Problem List Items Addressed This Visit      Musculoskeletal and Integument   Hamstring injury, initial encounter - Primary     Suspect a hamstring injury from fast running onset 2 weeks ago.It is improving but still having pain.  May apply ice, rest and compression with an ACE wrap may be helpful, take Tylenol as directed.   Please see [facility] ASAP  and use the [facility]ALK IN for further evaluation and discussion about MRI of the thigh. They are open now and can see you now.   No orders of the defined types were placed in this encounter.   Follow-up: No follow-ups on file.  This visit occurred during the SARS-CoV-2  public health emergency.  Safety protocols were in place, including screening questions prior to the visit, additional usage of staff PPE, and extensive cleaning of exam room while observing appropriate contact time as indicated for disinfecting solutions.    Amedeo Kinsman, NP

## 2020-08-02 ENCOUNTER — Encounter: Payer: Self-pay | Admitting: Nurse Practitioner

## 2020-08-04 ENCOUNTER — Encounter: Payer: 59 | Admitting: Family

## 2020-08-05 ENCOUNTER — Other Ambulatory Visit: Payer: Self-pay | Admitting: Family

## 2020-08-05 DIAGNOSIS — E119 Type 2 diabetes mellitus without complications: Secondary | ICD-10-CM

## 2020-08-11 ENCOUNTER — Telehealth: Payer: Self-pay | Admitting: Family

## 2020-08-11 ENCOUNTER — Other Ambulatory Visit: Payer: Self-pay

## 2020-08-11 MED ORDER — LINAGLIPTIN 5 MG PO TABS: 5.0000 mg | ORAL_TABLET | Freq: Every day | ORAL | 0 refills | Status: DC

## 2020-08-11 MED ORDER — TRULICITY 0.75 MG/0.5ML ~~LOC~~ SOAJ: 0.7500 mg | SUBCUTANEOUS | 1 refills | Status: DC

## 2020-08-11 NOTE — Telephone Encounter (Signed)
Call pt tradjenta has been denied by insurance  I thouught he had sch an DM follow up appt with me?   He needs an appt and we need to check a1c during visit so we can determine how to adjust his DM medications For now, it appears he is not on tradjenta or trulicity, please confirm he is on metformin, appears 1000mg  BID. Confirm this dose.

## 2020-08-11 NOTE — Telephone Encounter (Signed)
Patient is now scheduled for physical 11/3. He is currently taking all medications prescribed the tradjenta, trulicity & metformin 1000mg  BID. He does not check blood sugars however his wife said due to his work schedule. I asked that she advise patient to please at least check at night so we have an idea of what blood sugars are doing then.

## 2020-08-11 NOTE — Telephone Encounter (Signed)
Pt's wife called and states that she needs someone to return her call ASAP to get him an appt. She states that she will get him a different provider somewjere else if someone does not call her back today. She states that he had an appt and it got pushed out due to pt having a cough. I let her know about our office policy regarding coughs and covid symptoms. Please advise at your earliest convenience.

## 2020-08-11 NOTE — Telephone Encounter (Signed)
Can you add back trulicity, tradjenta to chart Lets add him to cancellation list

## 2020-08-11 NOTE — Telephone Encounter (Signed)
LMTCB to confirm medications as well as get pt scheduled for DM f/u.

## 2020-08-11 NOTE — Telephone Encounter (Signed)
I have added back to patient's chart.

## 2020-08-12 ENCOUNTER — Encounter: Payer: Self-pay | Admitting: Family

## 2020-08-13 NOTE — Telephone Encounter (Signed)
I called patient & offered him VV appointment on Monday 08/16/20 to discuss high blood sugars. Pt declined due to his work schedule. He said that he could possibly do a telephone visit the following Monday 08/23/20 at 9am when he had a break at work. He will keep BS log & sent numbers via mychart.

## 2020-08-20 ENCOUNTER — Encounter: Payer: 59 | Admitting: Family

## 2020-08-23 ENCOUNTER — Telehealth (INDEPENDENT_AMBULATORY_CARE_PROVIDER_SITE_OTHER): Payer: BC Managed Care – PPO | Admitting: Family

## 2020-08-23 ENCOUNTER — Encounter: Payer: Self-pay | Admitting: Family

## 2020-08-23 ENCOUNTER — Other Ambulatory Visit: Payer: Self-pay

## 2020-08-23 VITALS — Ht 73.0 in | Wt 213.0 lb

## 2020-08-23 DIAGNOSIS — E119 Type 2 diabetes mellitus without complications: Secondary | ICD-10-CM

## 2020-08-23 DIAGNOSIS — I10 Essential (primary) hypertension: Secondary | ICD-10-CM | POA: Diagnosis not present

## 2020-08-23 DIAGNOSIS — S8990XA Unspecified injury of unspecified lower leg, initial encounter: Secondary | ICD-10-CM | POA: Insufficient documentation

## 2020-08-23 DIAGNOSIS — M6010 Interstitial myositis of unspecified site: Secondary | ICD-10-CM | POA: Insufficient documentation

## 2020-08-23 DIAGNOSIS — E785 Hyperlipidemia, unspecified: Secondary | ICD-10-CM | POA: Diagnosis not present

## 2020-08-23 HISTORY — DX: Interstitial myositis of unspecified site: M60.10

## 2020-08-23 MED ORDER — TRULICITY 0.75 MG/0.5ML ~~LOC~~ SOAJ: 1.5000 mg | SUBCUTANEOUS | 1 refills | Status: DC

## 2020-08-23 NOTE — Progress Notes (Signed)
Verbal consent for services obtained from patient prior to services given to TELEPHONE visit:   Location of call:  provider at work patient at home  Names of all persons present for services: Rennie Plowman, NP and patient Chief complaint:  Feels well today No complaints Feels very active at work in [employer].  DM- last night eggs, sausage, croissant, slice of cake. FBG 270, 298, 168. Endorses urinary frequency.   Using fruits and vegetables. Using zero call [place], 'light' juices 1-2 per day. Soda are rare.  No personal or family h/o thyroid cancer.  No vision changes, fever, dysuria.  Insurance will not cover tradjenta.  HTN- compliant with lisinopril  No CP.   HLD - compliant with crestor  BP Readings from Last 3 Encounters:  07/30/20 120/78  08/29/18 122/88   Lab Results  Component Value Date   HGBA1C 6.4 (H) 11/16/2019   Wt Readings from Last 3 Encounters:  08/23/20 213 lb (96.6 kg)  07/30/20 213 lb (96.6 kg)  11/05/19 206 lb (93.4 kg)    A/P/next steps:  Problem List Items Addressed This Visit      Cardiovascular and Mediastinum   HTN (hypertension)    Stable, continue lisinopril 5mg .         Endocrine   Type 2 diabetes mellitus without complication, without long-term current use of insulin (HCC) - Primary    Uncontrolled. Continue metformin 1000mg  BID, increase trulicity to 1.5mg . Reviewed black box warning of thyroid cancer in regards to trulicity. Emphasized the importance of labs every 3 months, due to work schedule he will have labs done at [facility] in the next 1-2 weeks. Advised to eliminate sugary drinks. He declines nutrition consult at this time.       Relevant Medications   Dulaglutide (TRULICITY) 0.75 MG/0.5ML SOPN   Other Relevant Orders   Microalbumin / creatinine urine ratio   Comprehensive metabolic panel   Hemoglobin A1c   Lipid panel     Other   HLD (hyperlipidemia)    Uncontrolled. Pending lipid panel. Continue crestor  10mg  qd          I spent 21 min  discussing plan of care over the phone.

## 2020-08-23 NOTE — Assessment & Plan Note (Signed)
Stable, continue lisinopril 5mg.  

## 2020-08-23 NOTE — Assessment & Plan Note (Signed)
Uncontrolled. Pending lipid panel. Continue crestor 10mg  qd

## 2020-08-23 NOTE — Assessment & Plan Note (Addendum)
Uncontrolled. Continue metformin 1000mg  BID, increase trulicity to 1.5mg . Reviewed black box warning of thyroid cancer in regards to trulicity. Emphasized the importance of labs every 3 months, due to work schedule he will have labs done at [facility] in the next 1-2 weeks. Advised to eliminate sugary drinks. He declines nutrition consult at this time.

## 2020-08-25 ENCOUNTER — Other Ambulatory Visit: Payer: Self-pay

## 2020-08-25 MED ORDER — LANTUS SOLOSTAR 100 UNIT/ML ~~LOC~~ SOPN: 5.0000 [IU] | PEN_INJECTOR | Freq: Every day | SUBCUTANEOUS | 99 refills | Status: DC

## 2020-08-25 MED ORDER — PEN NEEDLES 32G X 6 MM MISC: 2 refills | Status: DC

## 2020-08-25 NOTE — Telephone Encounter (Signed)
I spoke with patient & went over [place] message with him. He stated that he has been on insulin in that past. He said that he does not usually eat breakfast nor does he have time to even microwave anything. He said one issue too, is that he eats dinner at different times due to his son have ball games & practices.He does have a logbook where he will log blood sugars & knows to send [place] readings of 3/4 days consecutively. I have sent in Lantus 5 units & pen needles to [facility]. I also send your instructions to patient through [place] message.

## 2020-08-28 ENCOUNTER — Other Ambulatory Visit
Admission: RE | Admit: 2020-08-28 | Discharge: 2020-08-28 | Disposition: A | Discharge status: Home / Self Care | Payer: BC Managed Care – PPO | Attending: Family | Admitting: Family

## 2020-08-28 DIAGNOSIS — E119 Type 2 diabetes mellitus without complications: Secondary | ICD-10-CM | POA: Diagnosis not present

## 2020-08-28 LAB — COMPREHENSIVE METABOLIC PANEL
ALT: 21 U/L (ref 0–44)
AST: 21 U/L (ref 15–41)
Albumin: 4.3 g/dL (ref 3.5–5.0)
Alkaline Phosphatase: 88 U/L (ref 38–126)
Anion gap: 10 (ref 5–15)
BUN: 15 mg/dL (ref 6–20)
CO2: 27 mmol/L (ref 22–32)
Calcium: 9.7 mg/dL (ref 8.9–10.3)
Chloride: 99 mmol/L (ref 98–111)
Creatinine, Ser: 0.96 mg/dL (ref 0.61–1.24)
GFR, Estimated: >60 mL/min (ref >60)
Glucose, Bld: 238 mg/dL — ABNORMAL HIGH (ref 70–99)
Potassium: 4.5 mmol/L (ref 3.5–5.1)
Sodium: 136 mmol/L (ref 135–145)
Total Bilirubin: 0.7 mg/dL (ref 0.3–1.2)
Total Protein: 6.9 g/dL (ref 6.5–8.1)

## 2020-08-28 LAB — LIPID PANEL
Cholesterol: 97 mg/dL (ref 0–200)
HDL: 44 mg/dL (ref >40)
LDL Cholesterol: 45 mg/dL (ref 0–99)
Total CHOL/HDL Ratio: 2.2 RATIO
Triglycerides: 38 mg/dL (ref <150)
VLDL: 8 mg/dL (ref 0–40)

## 2020-08-29 LAB — MICROALBUMIN / CREATININE URINE RATIO
Creatinine, Urine: 120.3 mg/dL
Microalb Creat Ratio: 19 mg/g creat (ref 0–29)
Microalb, Ur: 22.5 ug/mL — ABNORMAL HIGH

## 2020-08-30 LAB — HEMOGLOBIN A1C
Hgb A1c MFr Bld: 10.7 % — ABNORMAL HIGH (ref 4.8–5.6)
Mean Plasma Glucose: 260 mg/dL

## 2020-08-31 ENCOUNTER — Other Ambulatory Visit: Payer: Self-pay | Admitting: Family

## 2020-08-31 ENCOUNTER — Encounter: Payer: Self-pay | Admitting: Family

## 2020-08-31 DIAGNOSIS — E119 Type 2 diabetes mellitus without complications: Secondary | ICD-10-CM

## 2020-09-13 ENCOUNTER — Other Ambulatory Visit: Payer: Self-pay

## 2020-09-15 ENCOUNTER — Ambulatory Visit (INDEPENDENT_AMBULATORY_CARE_PROVIDER_SITE_OTHER): Payer: BC Managed Care – PPO | Admitting: Family

## 2020-09-15 ENCOUNTER — Ambulatory Visit (INDEPENDENT_AMBULATORY_CARE_PROVIDER_SITE_OTHER): Payer: BC Managed Care – PPO

## 2020-09-15 ENCOUNTER — Encounter: Payer: Self-pay | Admitting: Family

## 2020-09-15 ENCOUNTER — Other Ambulatory Visit: Payer: Self-pay

## 2020-09-15 VITALS — BP 114/78 | HR 93 | Temp 98.3°F | Ht 73.0 in | Wt 209.0 lb

## 2020-09-15 DIAGNOSIS — M545 Low back pain, unspecified: Secondary | ICD-10-CM

## 2020-09-15 DIAGNOSIS — Z125 Encounter for screening for malignant neoplasm of prostate: Secondary | ICD-10-CM

## 2020-09-15 DIAGNOSIS — M542 Cervicalgia: Secondary | ICD-10-CM | POA: Diagnosis not present

## 2020-09-15 DIAGNOSIS — E119 Type 2 diabetes mellitus without complications: Secondary | ICD-10-CM | POA: Diagnosis not present

## 2020-09-15 DIAGNOSIS — G8929 Other chronic pain: Secondary | ICD-10-CM | POA: Diagnosis not present

## 2020-09-15 DIAGNOSIS — I10 Essential (primary) hypertension: Secondary | ICD-10-CM

## 2020-09-15 DIAGNOSIS — Z0000 Encounter for general adult medical examination without abnormal findings: Secondary | ICD-10-CM | POA: Diagnosis not present

## 2020-09-15 DIAGNOSIS — M1712 Unilateral primary osteoarthritis, left knee: Secondary | ICD-10-CM | POA: Diagnosis not present

## 2020-09-15 DIAGNOSIS — M25562 Pain in left knee: Secondary | ICD-10-CM

## 2020-09-15 DIAGNOSIS — M47814 Spondylosis without myelopathy or radiculopathy, thoracic region: Secondary | ICD-10-CM | POA: Diagnosis not present

## 2020-09-15 MED ORDER — TRULICITY 0.75 MG/0.5ML ~~LOC~~ SOAJ: 1.5000 mg | SUBCUTANEOUS | 1 refills | Status: DC

## 2020-09-15 MED ORDER — EMPAGLIFLOZIN 10 MG PO TABS: 10.0000 mg | ORAL_TABLET | Freq: Every morning | ORAL | 1 refills | Status: DC

## 2020-09-15 MED ORDER — MELOXICAM 7.5 MG PO TABS: 7.5000 mg | ORAL_TABLET | Freq: Every day | ORAL | 1 refills | Status: DC | PRN

## 2020-09-15 NOTE — Patient Instructions (Addendum)
Start increased trulicity first of 1.5mg . We will increase this after 4 weeks. Please send blood glucose weekly.   We want to lower blood sugar first with trulicity THEN we will start jardiance as blood sugars come down   I have sent in Mobic for you to use as needed for knee, back pain. You do not need to take any other over the counter anti inflammatories  A couple of points in regards to meloxicam ( Mobic) -  This medication is not intended for daily , long term use. It is a potent anti inflammatory ( NSAID), and my intention is for you take as needed for moderate to severe pain. If you find yourself using daily, please let me know.   Please takes Mobic ( meloxicam) with FOOD since it is an anti-inflammatory as it can cause a GI bleed or ulcer. If you have a history of GI bleed or ulcer, please do NOT take.  Do no take over the counter aleve, motrin, advil, goody's powder for pain as they are also NSAIDs, and they are  in the same class as Mobic  Lastly, we will need to monitor kidney function while on Mobic, and if we were to see any decline in kidney function in the future, we would have to discontinue this medication.   Referral for orthopedic, colonoscopy.   Let me know how you are doing.

## 2020-09-15 NOTE — Assessment & Plan Note (Signed)
Severely uncontrolled. Increase trulicity to 1.5mg  for weeks and then likely increase again. Will work to start jardiance ( sent 10mg  in today) in the next few days as blood glucose improves.Concerned patient may develop UTI on jardiance with blood glucose as high as it was. We had trouble getting lantus approved and would like to exhaust non insulin therapies prior to starting lantus if we can. Patient understands to send weekly glucose as we may need to initiate insulin

## 2020-09-15 NOTE — Assessment & Plan Note (Signed)
Stable, continue lisinopril 5mg.  

## 2020-09-15 NOTE — Progress Notes (Signed)
Subjective:    Patient ID: Spencer Barber, male    DOB: [birth year], 49 y.o.   MRN: [id number]  CC: Spencer Barber is a 49 y.o. male who presents today for physical exam , follow up and acute complains.    HPI:  Complains of back from neck 'all the way down'. Pain along he entire back. NO recent imaging.  This has been on going for several years. 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   No weakness, numbness in legs, fecal incontinence.  weight stable.  No falls, injury.  No h/o cancer. Tried Aleve, Bayer without relief.   Complains left lateral knee pain for past month,unchanged. Pain is constant ache.  Pain worse when flexed and goes stand. No feeling of giving out. Tried neoprene sleeve with some relief.  No swelling, increased heat, redness.     No h/o GIB.   HTN- compliant with lisinopril 5mg  . No cp, sob, leg swelling.   DM-Compliant with trulicity 0.5mg .  Has been on in the past and doesn't recall why taken off .Insurance wouldn't cover the tradjenta 5mg    FBG 188, 246, 178, 208, 189, 182, 197, 201  After lunch 125, 185, 174, 216  After dinner: 328, 300, 269, 236, 247, 328        Colorectal  Cancer Screening: due Prostate Cancer Screening:due. No trouble urinating, no hesitancy.   Lung Cancer Screening: No 30 year pack year history and > 50 years to 80 years.   No family history of AAA.  Immunizations       Tetanus - due        Pneumococcal - Candidate for. Declines Hepatitis C screening - Candidate for, consents  Labs: Screening labs today. Exercise: No formal exercise.   Alcohol use:  Occassional Smoking/tobacco use: Nonsmoker.     HISTORY:  Past Medical History:  Diagnosis Date  . Diabetes mellitus without complication (HCC)     History reviewed. No pertinent surgical history. Family History  Problem Relation Age of Onset  . Diabetes Mother   . Alcohol abuse Father   . Colon  cancer Maternal Great-grandmother   . Thyroid cancer Neg Hx       ALLERGIES: Patient has no known allergies.  Current Outpatient Medications on File Prior to Visit  Medication Sig Dispense Refill  . lisinopril (ZESTRIL) 5 MG tablet Take 0.5 tablets (2.5 mg total) by mouth daily. 90 tablet 3  . metFORMIN (GLUCOPHAGE) 1000 MG tablet TAKE 1 TABLET BY MOUTH TWICE DAILY WITH A MEAL FOR DIABETES 180 tablet 0  . Multiple Vitamins-Minerals (MENS MULTIVITAMIN PO) Take by mouth.    . rosuvastatin (CRESTOR) 10 MG tablet Take 1 tablet (10 mg total) by mouth daily. 90 tablet 3   No current facility-administered medications on file prior to visit.    Social History   Tobacco Use  . Smoking status: Never Smoker  . Smokeless tobacco: Never Used  Vaping Use  . Vaping Use: Never used  Substance Use Topics  . Alcohol use: Yes  . Drug use: Never    Review of Systems  Constitutional: Negative for chills and fever.  HENT: Negative for congestion.   Respiratory: Negative for cough.   Cardiovascular: Negative for chest pain, palpitations and leg swelling.  Gastrointestinal: Negative for diarrhea, nausea and vomiting.  Musculoskeletal: Positive for arthralgias and back pain. Negative for myalgias.  Skin: Negative for rash.  Neurological: Negative for weakness, numbness and headaches.  Hematological: Negative for adenopathy.  Psychiatric/Behavioral: Negative for confusion.      Objective:    BP 114/78 (BP Location: Left Arm, Patient Position: Sitting, Cuff Size: Normal)   Pulse 93   Temp 98.3 F (36.8 C) (Oral)   Ht 6\' 1"  (1.854 m)   Wt 209 lb (94.8 kg)   SpO2 97%   BMI 27.57 kg/m   BP Readings from Last 3 Encounters:  09/15/20 114/78  07/30/20 120/78  08/29/18 122/88   Wt Readings from Last 3 Encounters:  09/15/20 209 lb (94.8 kg)  08/23/20 213 lb (96.6 kg)  07/30/20 213 lb (96.6 kg)    Physical Exam Vitals reviewed.  Constitutional:      Appearance: He is well-developed.    Neck:     Thyroid: No thyroid mass or thyromegaly.  Cardiovascular:     Rate and Rhythm: Regular rhythm.     Heart sounds: Normal heart sounds.  Pulmonary:     Effort: Pulmonary effort is normal. No respiratory distress.     Breath sounds: Normal breath sounds. No wheezing, rhonchi or rales.  Musculoskeletal:     Cervical back: Normal. No tenderness or bony tenderness. No pain with movement.     Thoracic back: No bony tenderness. Normal range of motion.     Lumbar back: No swelling, spasms or tenderness. Normal range of motion. Negative right straight leg raise test and negative left straight leg raise test.     Right knee: No swelling. Normal range of motion.     Left knee: No swelling. Normal range of motion.       Legs:     Comments: Full range of motion with flexion, extension, lateral side bends. No pain, numbness, tingling elicited with single leg raise bilaterally. No rash. Bilateral knees are symmetric. No effusion appreciated. Right knee extra articular soft , non tender fluid filled cyst appreciated and as noted on diagram. No cysts, swelling posterior knees bilaterally.  No increase in warmth or erythema. No crepitus felt with flexion of bilateral knees.  Left knee:  Able to extend to -5 to 10 degrees and flex to 110 degrees. No catching with McMurray maneuver. No patellar apprehension.   No calf tenderness of lower leg edema bilaterally.    Lymphadenopathy:     Head:     Right side of head: No submental, submandibular, tonsillar, preauricular, posterior auricular or occipital adenopathy.     Left side of head: No submental, submandibular, tonsillar, preauricular, posterior auricular or occipital adenopathy.     Cervical: No cervical adenopathy.  Skin:    General: Skin is warm and dry.  Neurological:     Mental Status: He is alert.  Psychiatric:        Speech: Speech normal.        Behavior: Behavior normal.        Assessment & Plan:   Problem List Items  Addressed This Visit      Cardiovascular and Mediastinum   HTN (hypertension)    Stable, continue lisinopril 5mg .         Endocrine   Type 2 diabetes mellitus without complication, without long-term current use of insulin (HCC)    Severely uncontrolled. Increase trulicity to 1.5mg  for weeks and then likely increase again. Will work to start jardiance ( sent 10mg  in today) in the next few days as blood glucose improves.Concerned patient may develop UTI on jardiance with blood glucose as high as it was. We had trouble getting lantus approved and  would like to exhaust non insulin therapies prior to starting lantus if we can. Patient understands to send weekly glucose as we may need to initiate insulin      Relevant Medications   Dulaglutide (TRULICITY) 0.75 MG/0.5ML SOPN   empagliflozin (JARDIANCE) 10 MG TABS tablet     Other   Chronic bilateral low back pain without sciatica    Chronic. Overall benign exam. Suspect degenerative disc disease. Referral placed to orthopedics for further evaluation, treatment.       Relevant Medications   meloxicam (MOBIC) 7.5 MG tablet   Left knee pain    Most bothersome for patient particularly with line of work. Differentials include patella femoral syndrome, meniscal etiology. Referral placed for orthopedics for evaluation of left knee as well as non tender cyst noted on right knee. Will follow.       Relevant Medications   meloxicam (MOBIC) 7.5 MG tablet   Other Relevant Orders   DG Knee 1-2 Views Left (Completed)   Ambulatory referral to Orthopedic Surgery   Routine physical examination - Primary    Colonoscopy referral placed. Declines vaccines today. Encouraged regular exercise.       Relevant Orders   PSA (Completed)   Hepatitis C antibody   Ambulatory referral to Gastroenterology    Other Visit Diagnoses    Chronic midline low back pain, unspecified whether sciatica present       Relevant Medications   meloxicam (MOBIC) 7.5 MG tablet     Other Relevant Orders   DG Cervical Spine Complete (Completed)   DG Lumbar Spine Complete (Completed)   DG Thoracic Spine W/Swimmers (Completed)       I have discontinued Kiante Haste "Cam"'s Pen Needles. I have also changed his Trulicity. Additionally, I am having him start on empagliflozin and meloxicam. Lastly, I am having him maintain his rosuvastatin, lisinopril, metFORMIN, and Multiple Vitamins-Minerals (MENS MULTIVITAMIN PO).   Meds ordered this encounter  Medications  . Dulaglutide (TRULICITY) 0.75 MG/0.5ML SOPN    Sig: Inject 1.5 mg into the skin once a week.    Dispense:  4 mL    Refill:  1    Order Specific Question:   Supervising Provider    Answer:   Darrick Huntsman, TERESA L [[id number]]  . empagliflozin (JARDIANCE) 10 MG TABS tablet    Sig: Take 1 tablet (10 mg total) by mouth every morning.    Dispense:  90 tablet    Refill:  1    Order Specific Question:   Supervising Provider    Answer:   Duncan Dull L [[id number]]  . meloxicam (MOBIC) 7.5 MG tablet    Sig: Take 1 tablet (7.5 mg total) by mouth daily as needed for pain.    Dispense:  30 tablet    Refill:  1    Order Specific Question:   Supervising Provider    Answer:   Sherlene Shams [[id number]]    Return precautions given.   Risks, benefits, and alternatives of the medications and treatment plan prescribed today were discussed, and patient expressed understanding.   Education regarding symptom management and diagnosis given to patient on AVS.   Continue to follow with Allegra Grana, FNP for routine health maintenance.   Darcus Austin and I agreed with plan.   Rennie Plowman, FNP

## 2020-09-16 LAB — PSA: PSA: 0.45 ng/mL (ref 0.10–4.00)

## 2020-09-20 LAB — HEPATITIS C ANTIBODY
Hepatitis C Ab: NONREACTIVE
SIGNAL TO CUT-OFF: 0.02 (ref <1.00)

## 2020-09-20 NOTE — Assessment & Plan Note (Addendum)
Most bothersome for patient particularly with line of work. Differentials include patella femoral syndrome, meniscal etiology. Referral placed for orthopedics for evaluation of left knee as well as non tender cyst noted on right knee. Prescribed mobic prn. Will follow.

## 2020-09-20 NOTE — Assessment & Plan Note (Signed)
Chronic. Overall benign exam. Suspect degenerative disc disease. Referral placed to orthopedics for further evaluation, treatment.

## 2020-09-20 NOTE — Assessment & Plan Note (Signed)
Colonoscopy referral placed. Declines vaccines today. Encouraged regular exercise.

## 2020-09-21 ENCOUNTER — Telehealth: Payer: Self-pay | Admitting: Family

## 2020-09-21 ENCOUNTER — Encounter: Payer: Self-pay | Admitting: Family

## 2020-09-21 NOTE — Telephone Encounter (Signed)
Rejection Reason - Patient did not respond" [facility] [place] said on Sep 21, 2020 10:13 AM  "We have contacted this patient 2 times, left 2 messages and mailed a letter. This patient has not contacted [place] back to schedule. Referral is being closed due to time sensitivity but pt has our info to call and schedule. We will still be happy to assist your office and the patient. Thank you!" [facility] [place] said on Sep 21, 2020 10:13 AM  "Thank you for the referral. We have left a voicemail for this patient to contact our office to schedule an appointment. We will keep you updated as we can. Thank you" [facility] [place] said on Sep 16, 2020 10:07 AM

## 2020-09-24 ENCOUNTER — Encounter: Payer: Self-pay | Admitting: *Deleted

## 2020-09-27 ENCOUNTER — Encounter: Payer: Self-pay | Admitting: Family

## 2020-09-28 ENCOUNTER — Other Ambulatory Visit: Payer: Self-pay

## 2020-09-30 ENCOUNTER — Other Ambulatory Visit: Payer: Self-pay

## 2020-09-30 DIAGNOSIS — M1712 Unilateral primary osteoarthritis, left knee: Secondary | ICD-10-CM | POA: Diagnosis not present

## 2020-09-30 MED ORDER — TRULICITY 1.5 MG/0.5ML ~~LOC~~ SOAJ: 1.5000 mg | SUBCUTANEOUS | 2 refills | Status: DC

## 2020-10-01 ENCOUNTER — Other Ambulatory Visit: Payer: Self-pay

## 2020-10-01 ENCOUNTER — Telehealth (INDEPENDENT_AMBULATORY_CARE_PROVIDER_SITE_OTHER): Payer: Self-pay | Admitting: Gastroenterology

## 2020-10-01 DIAGNOSIS — Z1211 Encounter for screening for malignant neoplasm of colon: Secondary | ICD-10-CM

## 2020-10-01 NOTE — Progress Notes (Signed)
Gastroenterology Pre-Procedure Review  Request Date: Not Scheduled Yet Needs to check with wife Requesting Physician: Dr. Tobi Bastos  PATIENT REVIEW QUESTIONS: The patient responded to the following health history questions as indicated:    1. Are you having any GI issues? no 2. Do you have a personal history of Polyps? no 3. Do you have a family history of Colon Cancer or Polyps? yes (Maternal grandmother colon cancer) 4. Diabetes Mellitus? yes (type 2) 5. Joint replacements in the past 12 months?no 6. Major health problems in the past 3 months?no 7. Any artificial heart valves, MVP, or defibrillator?no    MEDICATIONS & ALLERGIES:    Patient reports the following regarding taking any anticoagulation/antiplatelet therapy:   Plavix, Coumadin, Eliquis, Xarelto, Lovenox, Pradaxa, Brilinta, or Effient? no Aspirin? no  Patient confirms/reports the following medications:  Current Outpatient Medications  Medication Sig Dispense Refill  . Dulaglutide (TRULICITY) 1.5 MG/0.5ML SOPN Inject 1.5 mg into the skin once a week. 4 mL 2  . empagliflozin (JARDIANCE) 10 MG TABS tablet Take 1 tablet (10 mg total) by mouth every morning. 90 tablet 1  . lisinopril (ZESTRIL) 5 MG tablet Take 0.5 tablets (2.5 mg total) by mouth daily. 90 tablet 3  . meloxicam (MOBIC) 7.5 MG tablet Take 1 tablet (7.5 mg total) by mouth daily as needed for pain. 30 tablet 1  . metFORMIN (GLUCOPHAGE) 1000 MG tablet TAKE 1 TABLET BY MOUTH TWICE DAILY WITH A MEAL FOR DIABETES 180 tablet 0  . Multiple Vitamins-Minerals (MENS MULTIVITAMIN PO) Take by mouth.    . rosuvastatin (CRESTOR) 10 MG tablet Take 1 tablet (10 mg total) by mouth daily. 90 tablet 3   No current facility-administered medications for this visit.    Patient confirms/reports the following allergies:  No Known Allergies  No orders of the defined types were placed in this encounter.   AUTHORIZATION INFORMATION Primary Insurance: 1D#: Group #:  Secondary  Insurance: 1D#: Group #:  SCHEDULE INFORMATION: Date: To Be Determined Time: Location:[facility]

## 2020-10-04 ENCOUNTER — Other Ambulatory Visit: Payer: Self-pay

## 2020-10-04 MED ORDER — NA SULFATE-K SULFATE-MG SULF 17.5-3.13-1.6 GM/177ML PO SOLN
1.0000 | Freq: Once | ORAL | 0 refills | Status: AC
Start: 2020-10-04 — End: 2020-10-04

## 2020-10-05 ENCOUNTER — Other Ambulatory Visit: Payer: Self-pay

## 2020-10-05 DIAGNOSIS — Z1211 Encounter for screening for malignant neoplasm of colon: Secondary | ICD-10-CM

## 2020-10-22 ENCOUNTER — Other Ambulatory Visit: Payer: Self-pay | Admitting: Family

## 2020-10-29 ENCOUNTER — Telehealth: Payer: Self-pay | Admitting: Gastroenterology

## 2020-10-29 NOTE — Telephone Encounter (Signed)
Needs to reschedule procedure.

## 2020-10-29 NOTE — Telephone Encounter (Signed)
Returned patients call. LVM to call office back. 

## 2020-11-01 ENCOUNTER — Other Ambulatory Visit: Payer: Self-pay

## 2020-11-01 ENCOUNTER — Other Ambulatory Visit: Admission: RE | Admit: 2020-11-01 | Payer: BC Managed Care – PPO | Source: Ambulatory Visit

## 2020-11-01 NOTE — Progress Notes (Signed)
Updated instructions will be sent out.

## 2020-11-15 IMAGING — DX DG LUMBAR SPINE COMPLETE 4+V
5 series · 5 of 5 positions shown · non-contrast
Comparison: None.

CLINICAL DATA: Low back pain.

EXAM:
LUMBAR SPINE - COMPLETE 4+ VIEW

[lumbar spine ap]
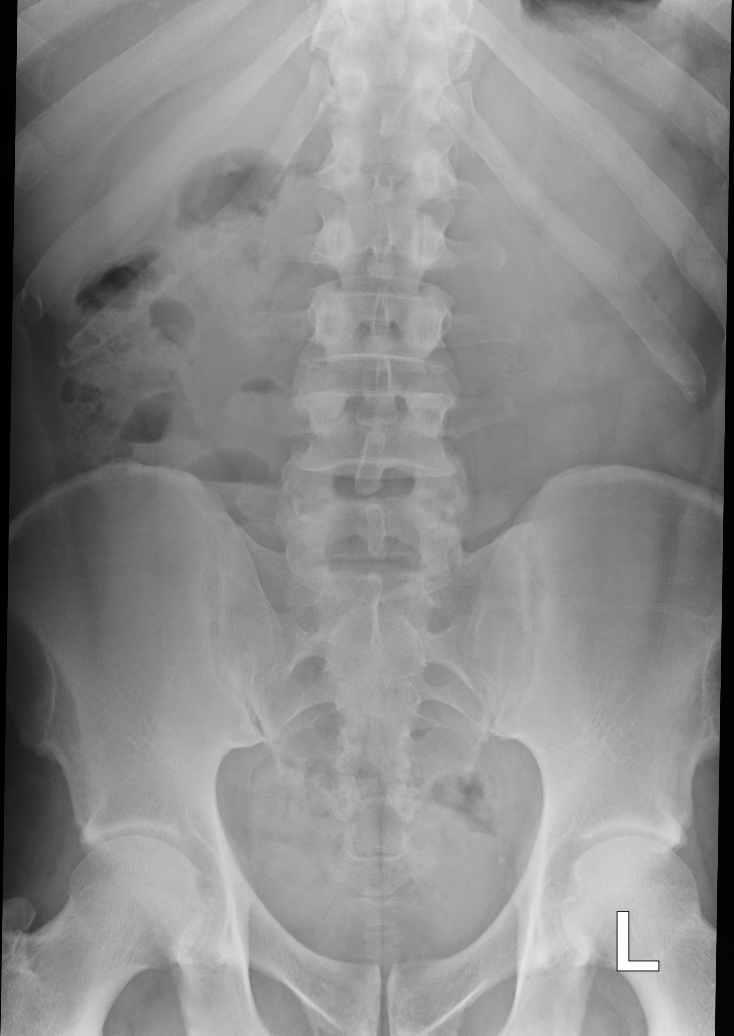

[lumbar spine obl (oblique) (1 of 2)]
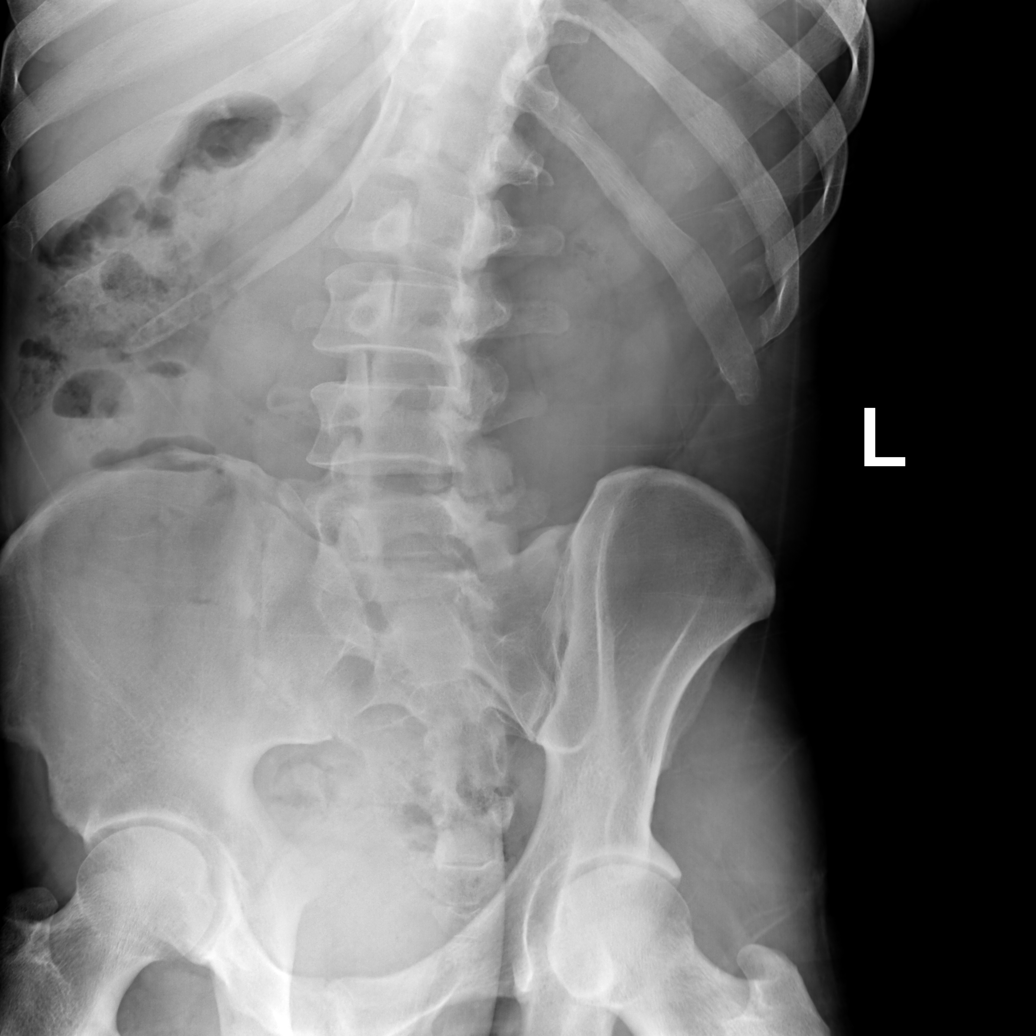

[lumbar spine obl (oblique) (2 of 2)]
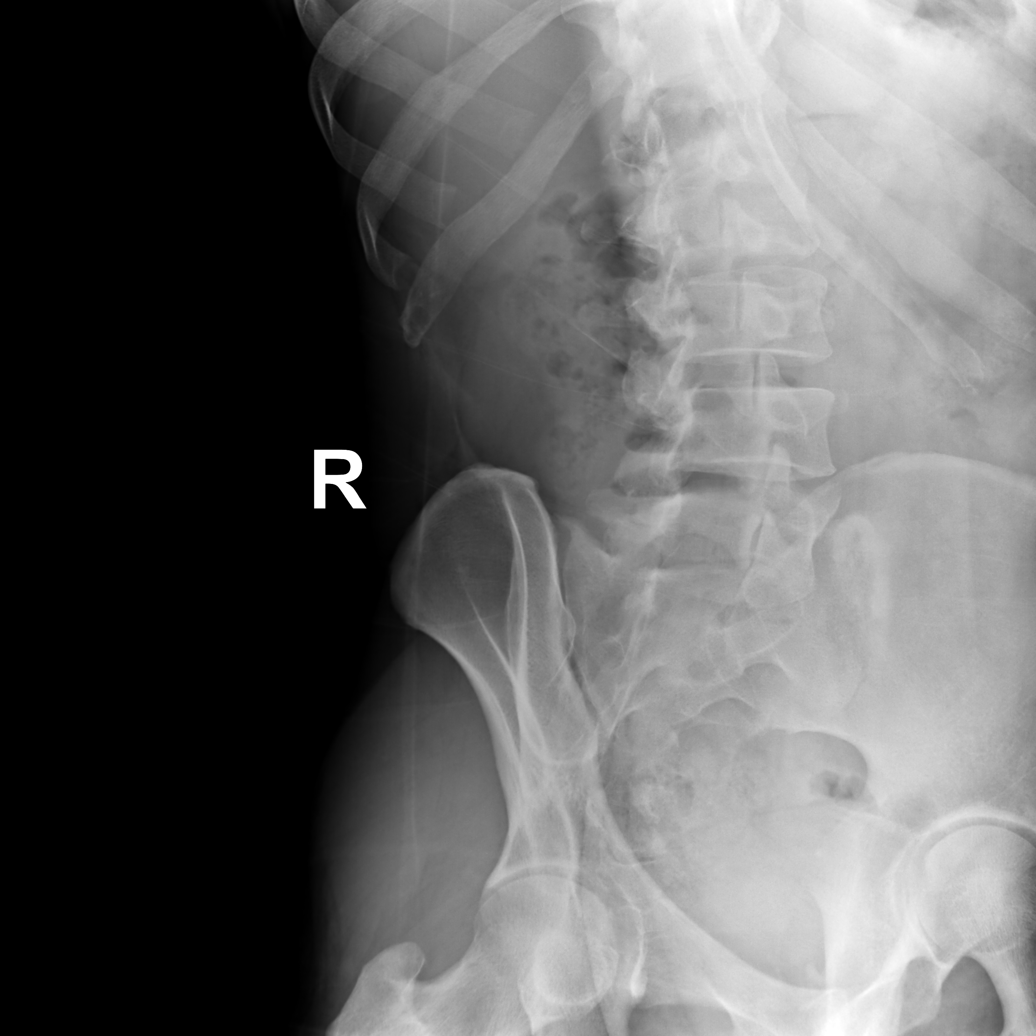

[lumbar spine lat]
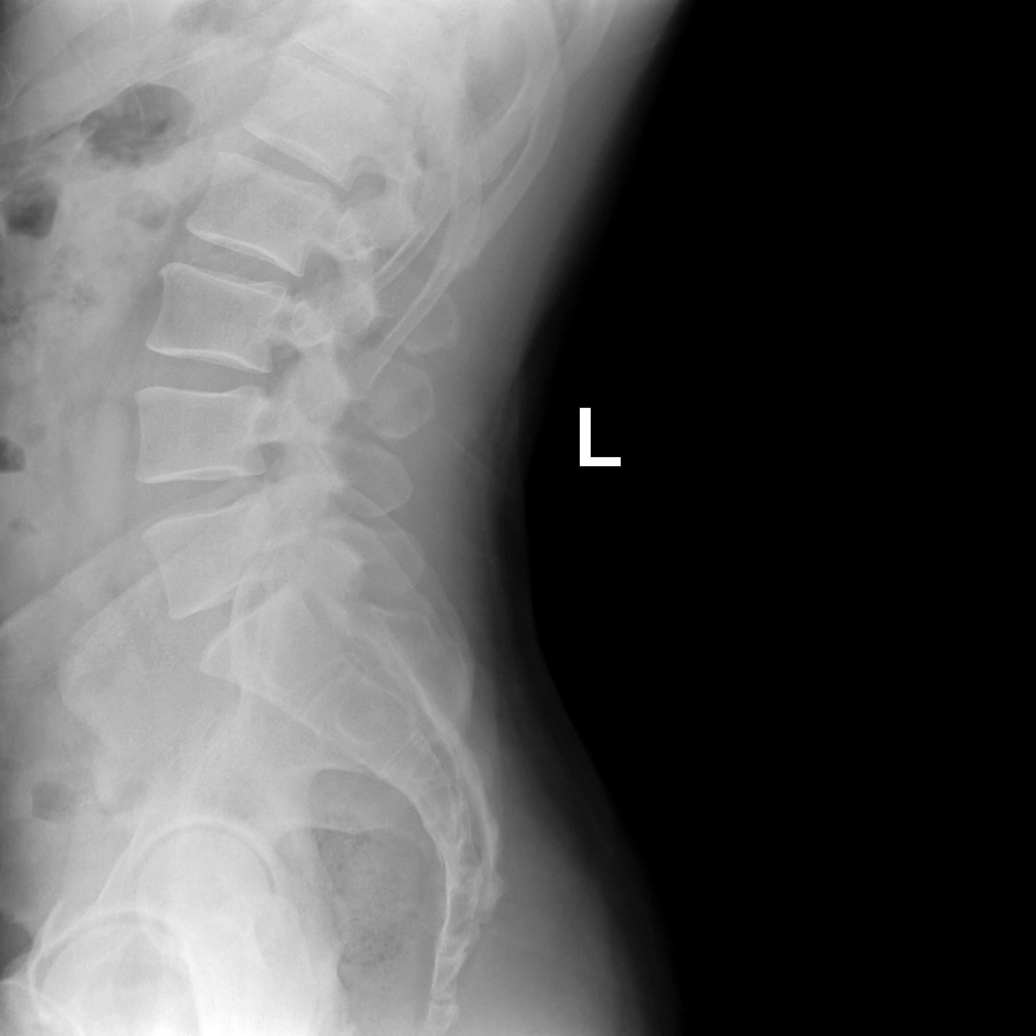

[lumbar spot lat]
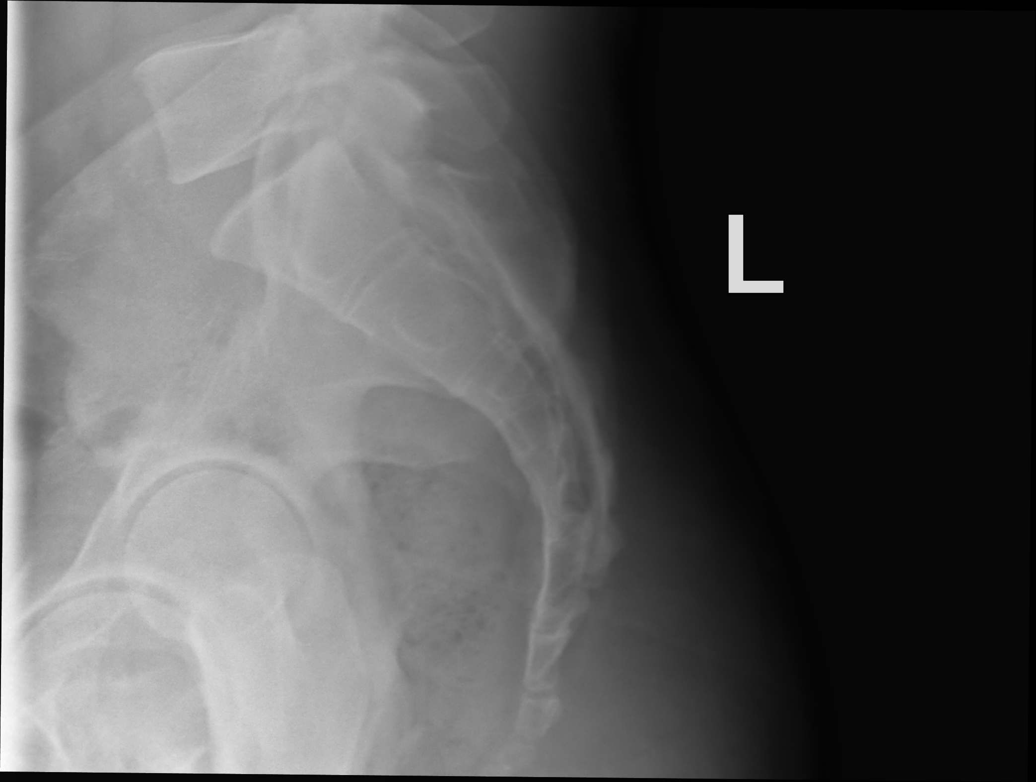

[5 of 5 positions shown; findings below may reference images not displayed]

FINDINGS: No evidence of acute fracture or malalignment. Intervertebral discs
are maintained. Lower lumbar facet arthropathy.
IMPRESSION: 1. No evidence of acute fracture or malalignment.
2. Intervertebral discs are maintained.
3. Lower lumbar facet arthropathy.

## 2020-11-15 IMAGING — DX DG KNEE 1-2V*L*
2 series · 2 of 2 positions shown · non-contrast
Comparison: None.

CLINICAL DATA: 48-year-old male with left lateral knee pain.

EXAM:
LEFT KNEE - 1-2 VIEW

[knee standing ap]
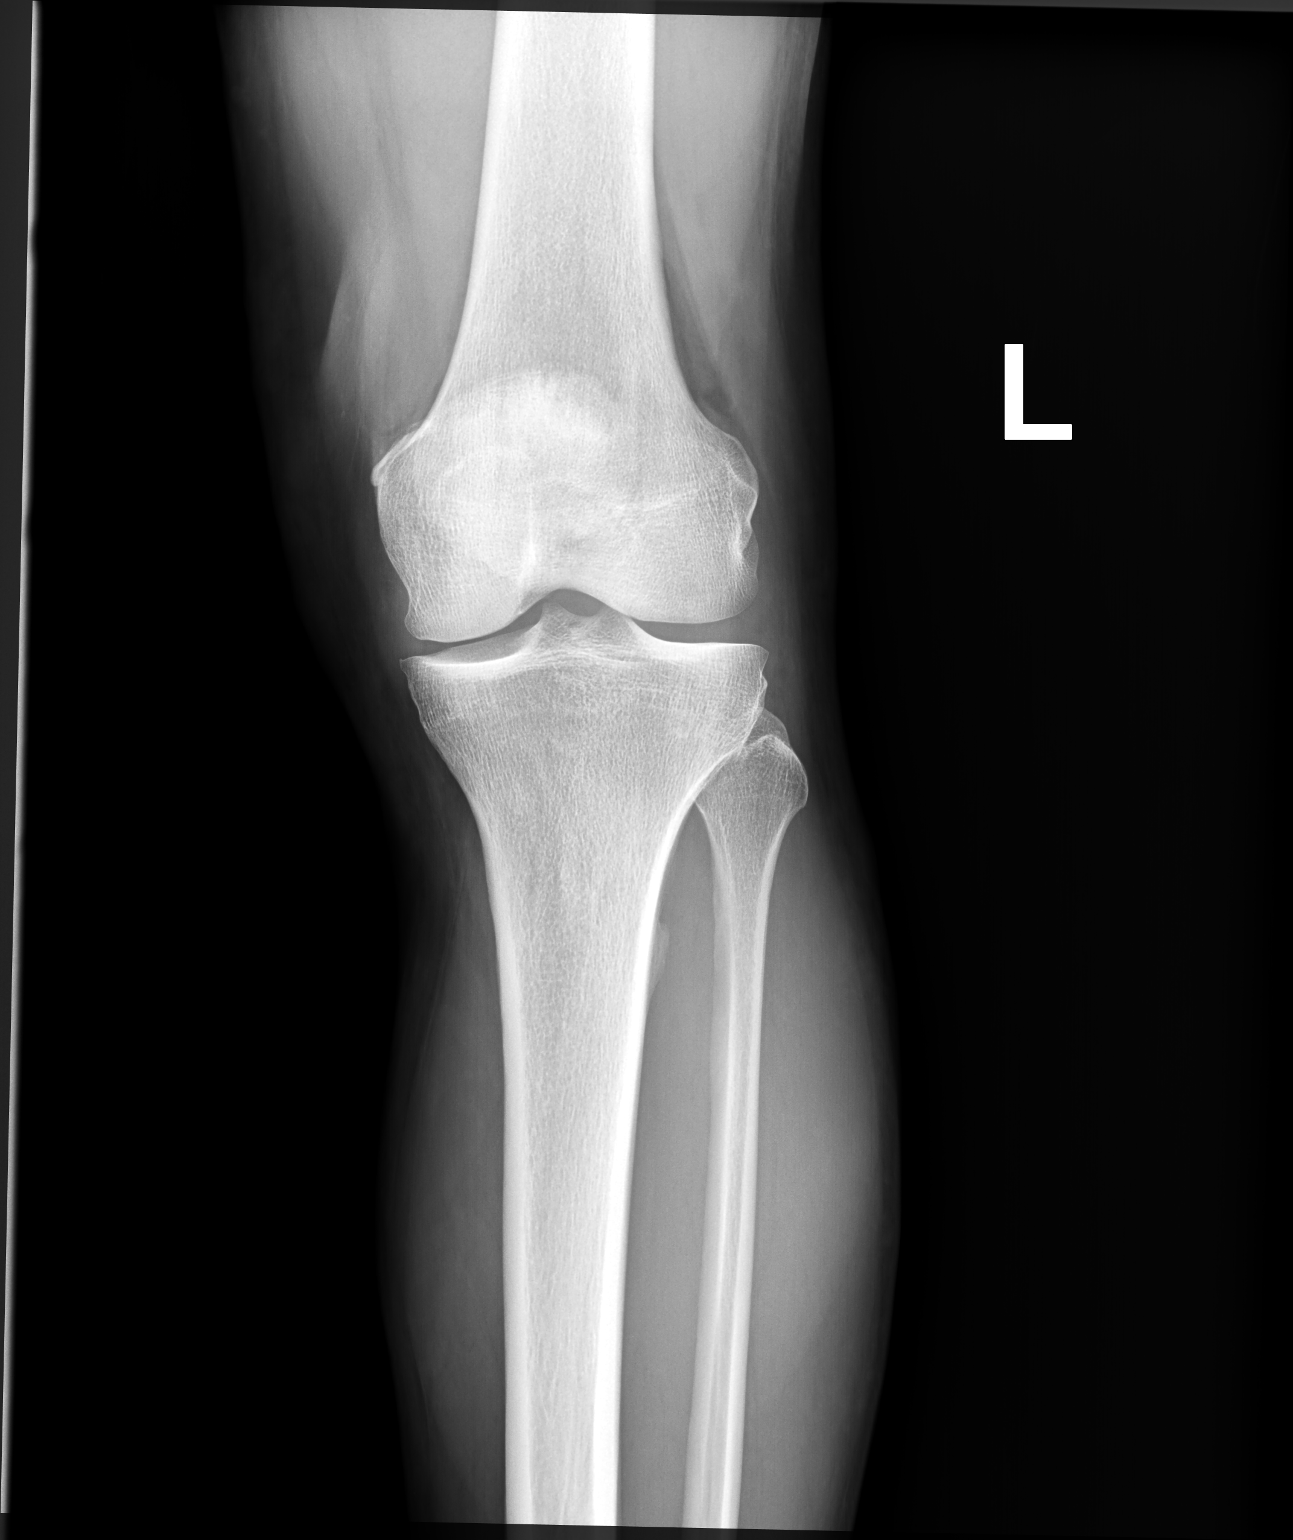

[knee lat]
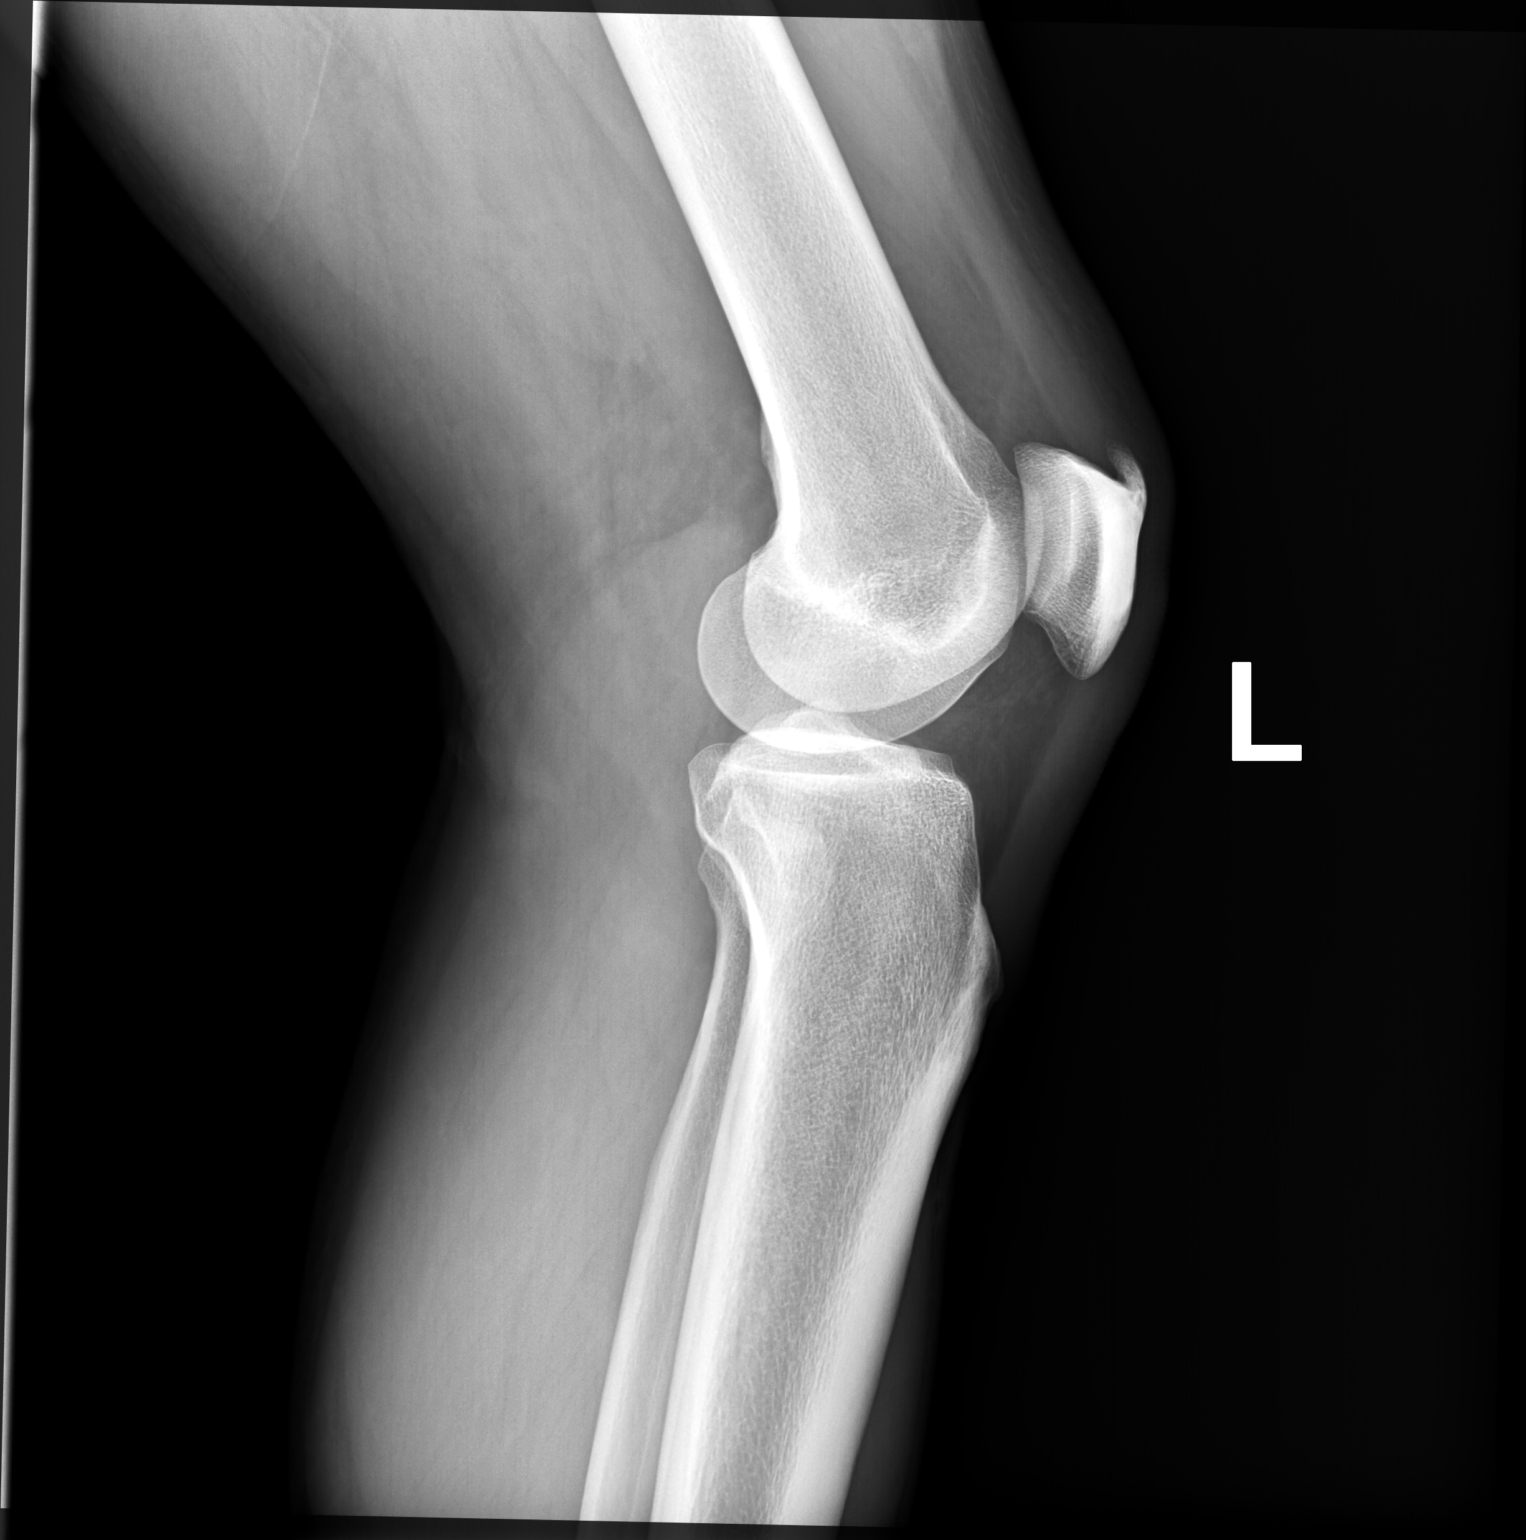

[2 of 2 positions shown; findings below may reference images not displayed]

FINDINGS: Bone mineralization is within normal limits. Mild medial compartment
joint space loss and degenerative spurring. Patella appears intact
with moderate osteophytosis at the quadriceps tendon insertion. The
lateral compartment appears within normal limits. No evidence of
joint effusion. No acute osseous abnormality identified.
IMPRESSION: Medial compartment degenerative changes. No acute osseous
abnormality identified.

## 2020-12-02 ENCOUNTER — Other Ambulatory Visit
Admission: RE | Admit: 2020-12-02 | Discharge: 2020-12-02 | Disposition: A | Discharge status: Home / Self Care | Payer: BC Managed Care – PPO | Source: Ambulatory Visit | Attending: Gastroenterology | Admitting: Gastroenterology

## 2020-12-02 ENCOUNTER — Other Ambulatory Visit: Payer: Self-pay

## 2020-12-02 DIAGNOSIS — Z01812 Encounter for preprocedural laboratory examination: Secondary | ICD-10-CM | POA: Diagnosis not present

## 2020-12-02 DIAGNOSIS — Z20822 Contact with and (suspected) exposure to covid-19: Secondary | ICD-10-CM | POA: Diagnosis not present

## 2020-12-02 LAB — SARS CORONAVIRUS 2 (TAT 6-24 HRS): SARS Coronavirus 2: NEGATIVE

## 2020-12-06 ENCOUNTER — Other Ambulatory Visit: Payer: Self-pay

## 2020-12-06 ENCOUNTER — Ambulatory Visit: Payer: BC Managed Care – PPO | Admitting: Anesthesiology

## 2020-12-06 ENCOUNTER — Encounter: Payer: Self-pay | Admitting: Gastroenterology

## 2020-12-06 ENCOUNTER — Ambulatory Visit
Admission: RE | Admit: 2020-12-06 | Discharge: 2020-12-06 | Disposition: A | Discharge status: Home / Self Care | Payer: BC Managed Care – PPO | Attending: Gastroenterology | Admitting: Gastroenterology

## 2020-12-06 ENCOUNTER — Encounter
Admission: RE | Disposition: A | Discharge status: Home / Self Care | Payer: Self-pay | Source: Home / Self Care | Attending: Gastroenterology

## 2020-12-06 DIAGNOSIS — Z833 Family history of diabetes mellitus: Secondary | ICD-10-CM | POA: Diagnosis not present

## 2020-12-06 DIAGNOSIS — Z800 Family history of malignant neoplasm of digestive organs: Secondary | ICD-10-CM | POA: Insufficient documentation

## 2020-12-06 DIAGNOSIS — Z791 Long term (current) use of non-steroidal anti-inflammatories (NSAID): Secondary | ICD-10-CM | POA: Insufficient documentation

## 2020-12-06 DIAGNOSIS — E119 Type 2 diabetes mellitus without complications: Secondary | ICD-10-CM | POA: Diagnosis not present

## 2020-12-06 DIAGNOSIS — Z79899 Other long term (current) drug therapy: Secondary | ICD-10-CM | POA: Diagnosis not present

## 2020-12-06 DIAGNOSIS — Z811 Family history of alcohol abuse and dependence: Secondary | ICD-10-CM | POA: Insufficient documentation

## 2020-12-06 DIAGNOSIS — Z1211 Encounter for screening for malignant neoplasm of colon: Secondary | ICD-10-CM | POA: Diagnosis not present

## 2020-12-06 DIAGNOSIS — Z7984 Long term (current) use of oral hypoglycemic drugs: Secondary | ICD-10-CM | POA: Diagnosis not present

## 2020-12-06 HISTORY — PX: COLONOSCOPY WITH PROPOFOL: SHX5780

## 2020-12-06 HISTORY — DX: Essential (primary) hypertension: I10

## 2020-12-06 HISTORY — DX: Pure hypercholesterolemia, unspecified: E78.00

## 2020-12-06 LAB — GLUCOSE, CAPILLARY: Glucose-Capillary: 70 mg/dL (ref 70–99)

## 2020-12-06 SURGERY — COLONOSCOPY WITH PROPOFOL
Anesthesia: General

## 2020-12-06 MED ORDER — SODIUM CHLORIDE 0.9 % IV SOLN: INTRAVENOUS | Status: DC

## 2020-12-06 MED ORDER — LIDOCAINE HCL (PF) 2 % IJ SOLN
INTRAMUSCULAR | Status: AC
  Filled 2020-12-06: qty 5

## 2020-12-06 MED ORDER — LIDOCAINE 2% (20 MG/ML) 5 ML SYRINGE
INTRAMUSCULAR | Status: DC | PRN
  Administered 2020-12-06: 50 mg via INTRAVENOUS

## 2020-12-06 MED ORDER — PROPOFOL 500 MG/50ML IV EMUL
INTRAVENOUS | Status: AC
  Filled 2020-12-06: qty 50

## 2020-12-06 MED ORDER — PROPOFOL 500 MG/50ML IV EMUL
INTRAVENOUS | Status: DC | PRN
  Administered 2020-12-06: 150 ug/kg/min via INTRAVENOUS

## 2020-12-06 MED ORDER — PROPOFOL 10 MG/ML IV BOLUS
INTRAVENOUS | Status: DC | PRN
  Administered 2020-12-06: 90 mg via INTRAVENOUS
  Administered 2020-12-06: 10 mg via INTRAVENOUS

## 2020-12-06 NOTE — Op Note (Signed)
[facility] Gastroenterology Patient Name: Spencer Barber Procedure Date: 12/06/2020 9:33 AM MRN: [id number] Account #: [id number] Date of Birth: 10/09/1971 Admit Type: Outpatient Age: 50 Room: [facility] ENDO ROOM 2 Gender: Male Note Status: Finalized Procedure:             Colonoscopy Indications:           Screening for colorectal malignant neoplasm, This is                         the patient's first colonoscopy Providers:             Toney Reil MD, MD Medicines:             General Anesthesia Complications:         No immediate complications. Estimated blood loss: None. Procedure:             Pre-Anesthesia Assessment:                        - Prior to the procedure, a History and Physical was                         performed, and patient medications and allergies were                         reviewed. The patient is competent. The risks and                         benefits of the procedure and the sedation options and                         risks were discussed with the patient. All questions                         were answered and informed consent was obtained.                         Patient identification and proposed procedure were                         verified by the physician, the nurse, the                         anesthesiologist, the anesthetist and the technician                         in the pre-procedure area in the procedure room in the                         endoscopy suite. Mental Status Examination: alert and                         oriented. Airway Examination: normal oropharyngeal                         airway and neck mobility. Respiratory Examination:                         clear to auscultation. CV Examination: normal.  Prophylactic Antibiotics: The patient does not require                         prophylactic antibiotics. Prior Anticoagulants: The                         patient has taken no previous  anticoagulant or                         antiplatelet agents. ASA Grade Assessment: II - A                         patient with mild systemic disease. After reviewing                         the risks and benefits, the patient was deemed in                         satisfactory condition to undergo the procedure. The                         anesthesia plan was to use general anesthesia.                         Immediately prior to administration of medications,                         the patient was re-assessed for adequacy to receive                         sedatives. The heart rate, respiratory rate, oxygen                         saturations, blood pressure, adequacy of pulmonary                         ventilation, and response to care were monitored                         throughout the procedure. The physical status of the                         patient was re-assessed after the procedure.                        After obtaining informed consent, the colonoscope was                         passed under direct vision. Throughout the procedure,                         the patient's blood pressure, pulse, and oxygen                         saturations were monitored continuously. The                         Colonoscope was introduced through the anus and  advanced to the the cecum, identified by appendiceal                         orifice and ileocecal valve. The colonoscopy was                         performed without difficulty. The patient tolerated                         the procedure well. The quality of the bowel                         preparation was evaluated using the BBPS [facility] Bowel                         Preparation Scale) with scores of: Right Colon = 3,                         Transverse Colon = 3 and Left Colon = 3 (entire mucosa                         seen well with no residual staining, small fragments                         of stool or  opaque liquid). The total BBPS score                         equals 9. Findings:      The perianal and digital rectal examinations were normal. Pertinent       negatives include normal sphincter tone and no palpable rectal lesions.      The entire examined colon appeared normal.      The retroflexed view of the distal rectum and anal verge was normal and       showed no anal or rectal abnormalities. Impression:            - The entire examined colon is normal.                        - The distal rectum and anal verge are normal on                         retroflexion view.                        - No specimens collected. Recommendation:        - Discharge patient to home (with escort).                        - Resume previous diet today.                        - Continue present medications.                        - Repeat colonoscopy in 10 years for screening                         purposes. Procedure Code(s):     ---  Professional ---                        W1191, Colorectal cancer screening; colonoscopy on                         individual not meeting criteria for high risk Diagnosis Code(s):     --- Professional ---                        Z12.11, Encounter for screening for malignant neoplasm                         of colon CPT copyright 2019 American Medical Association. All rights reserved. The codes documented in this report are preliminary and upon coder review may  be revised to meet current compliance requirements. Dr. Libby Maw Toney Reil MD, MD 12/06/2020 10:04:07 AM This report has been signed electronically. Number of Addenda: 0 Note Initiated On: 12/06/2020 9:33 AM Scope Withdrawal Time: 0 hours 6 minutes 59 seconds  Total Procedure Duration: 0 hours 10 minutes 20 seconds  Estimated Blood Loss:  Estimated blood loss: none.      [facility]

## 2020-12-06 NOTE — Transfer of Care (Signed)
Immediate Anesthesia Transfer of Care Note  Patient: Spencer Barber  Procedure(s) Performed: COLONOSCOPY WITH PROPOFOL (N/A )  Patient Location: Endoscopy Unit  Anesthesia Type:General  Level of Consciousness: sedated  Airway & Oxygen Therapy: Patient connected to nasal cannula oxygen  Post-op Assessment: Post -op Vital signs reviewed and stable  Post vital signs: stable  Last Vitals:  Vitals Value Taken Time  BP 112/77 12/06/20 1007  Temp    Pulse 70 12/06/20 1008  Resp 15 12/06/20 1008  SpO2 99 % 12/06/20 1008  Vitals shown include unvalidated device data.  Last Pain:  Vitals:   12/06/20 0753  TempSrc: Temporal  PainSc: 0-No pain         Complications: No complications documented.

## 2020-12-06 NOTE — Anesthesia Preprocedure Evaluation (Addendum)
Anesthesia Evaluation  Patient identified by MRN, date of birth, ID band Patient awake    Reviewed: Allergy & Precautions, H&P , NPO status , Patient's Chart, lab work & pertinent test results  History of Anesthesia Complications Negative for: history of anesthetic complications  Airway Mallampati: II  TM Distance: >3 FB     Dental  (+) Teeth Intact   Pulmonary neg pulmonary ROS, neg sleep apnea, neg COPD,    breath sounds clear to auscultation       Cardiovascular hypertension, (-) angina(-) Past MI and (-) Cardiac Stents (-) dysrhythmias  Rhythm:regular Rate:Normal     Neuro/Psych negative neurological ROS  negative psych ROS   GI/Hepatic negative GI ROS, Neg liver ROS,   Endo/Other  diabetes  Renal/GU negative Renal ROS  negative genitourinary   Musculoskeletal   Abdominal   Peds  Hematology negative hematology ROS (+)   Anesthesia Other Findings Past Medical History: No date: Diabetes mellitus without complication (HCC) No date: Hypercholesteremia No date: Hypertension  Past Surgical History: No date: WISDOM TOOTH EXTRACTION  BMI    Body Mass Index: 25.94 kg/m      Reproductive/Obstetrics negative OB ROS                           Anesthesia Physical Anesthesia Plan  ASA: II  Anesthesia Plan: General   Post-op Pain Management:    Induction:   PONV Risk Score and Plan: Propofol infusion and TIVA  Airway Management Planned: Nasal Cannula  Additional Equipment:   Intra-op Plan:   Post-operative Plan:   Informed Consent: I have reviewed the patients History and Physical, chart, labs and discussed the procedure including the risks, benefits and alternatives for the proposed anesthesia with the patient or authorized representative who has indicated his/her understanding and acceptance.     Dental Advisory Given  Plan Discussed with: Anesthesiologist, CRNA and  Surgeon  Anesthesia Plan Comments:       Anesthesia Quick Evaluation

## 2020-12-06 NOTE — H&P (Signed)
Spencer Repress, MD [address]  [address]  [place], [place] [address]  Main: [phone]  Fax: [phone] Pager: [phone]  Primary Care Physician:  Allegra Grana, FNP Primary Gastroenterologist:  Dr. Arlyss Barber  Pre-Procedure History & Physical: HPI:  Spencer Barber is a 50 y.o. male is here for an colonoscopy.   Past Medical History:  Diagnosis Date  . Diabetes mellitus without complication (HCC)   . Hypercholesteremia   . Hypertension     Past Surgical History:  Procedure Laterality Date  . WISDOM TOOTH EXTRACTION      Prior to Admission medications   Medication Sig Start Date End Date Taking? Authorizing Provider  Dulaglutide (TRULICITY) 1.5 MG/0.5ML SOPN Inject 1.5 mg into the skin once a week. 09/30/20  Yes Arnett, Lyn Records, FNP  lisinopril (ZESTRIL) 5 MG tablet Take 0.5 tablets (2.5 mg total) by mouth daily. 11/21/19  Yes Allegra Grana, FNP  metFORMIN (GLUCOPHAGE) 1000 MG tablet TAKE 1 TABLET BY MOUTH TWICE DAILY WITH A MEAL FOR DIABETES 08/06/20  Yes Allegra Grana, FNP  Multiple Vitamins-Minerals (MENS MULTIVITAMIN PO) Take by mouth.   Yes [provider]  rosuvastatin (CRESTOR) 10 MG tablet Take 1 tablet by mouth once daily 10/22/20  Yes Arnett, Lyn Records, FNP  meloxicam (MOBIC) 7.5 MG tablet Take 1 tablet (7.5 mg total) by mouth daily as needed for pain. 09/15/20   Allegra Grana, FNP    Allergies as of 10/11/2020  . (No Known Allergies)    Family History  Problem Relation Age of Onset  . Diabetes Mother   . Alcohol abuse Father   . Colon cancer Maternal Great-grandmother   . Thyroid cancer Neg Hx     Social History   Socioeconomic History  . Marital status: Married    Spouse name: Not on file  . Number of children: Not on file  . Years of education: Not on file  . Highest education level: Not on file  Occupational History  . Not on file  Tobacco Use  . Smoking status: Never Smoker  . Smokeless tobacco:  Never Used  Vaping Use  . Vaping Use: Never used  Substance and Sexual Activity  . Alcohol use: Yes  . Drug use: Never  . Sexual activity: Yes  Other Topics Concern  . Not on file  Social History Narrative   Married   Has one son, 13 years.    Social Determinants of Health   Financial Resource Strain: Not on file  Food Insecurity: Not on file  Transportation Needs: Not on file  Physical Activity: Not on file  Stress: Not on file  Social Connections: Not on file  Intimate Partner Violence: Not on file    Review of Systems: See HPI, otherwise negative ROS  Physical Exam: BP 131/85   Pulse 63   Temp (!) 97 F (36.1 C) (Temporal)   Resp 16   Ht 6\' 2"  (1.88 m)   Wt 91.6 kg   SpO2 100%   BMI 25.94 kg/m  General:   Alert,  pleasant and cooperative in NAD Head:  Normocephalic and atraumatic. Neck:  Supple; no masses or thyromegaly. Lungs:  Clear throughout to auscultation.    Heart:  Regular rate and rhythm. Abdomen:  Soft, nontender and nondistended. Normal bowel sounds, without guarding, and without rebound.   Neurologic:  Alert and  oriented x4;  grossly normal neurologically.  Impression/Plan: Spencer Barber is here for an colonoscopy to be performed for  colon cancer screening  Risks, benefits, limitations, and alternatives regarding  colonoscopy have been reviewed with the patient.  Questions have been answered.  All parties agreeable.   Lannette Donath, MD  12/06/2020, 8:12 AM

## 2020-12-07 ENCOUNTER — Encounter: Payer: Self-pay | Admitting: Gastroenterology

## 2020-12-07 NOTE — Anesthesia Postprocedure Evaluation (Signed)
Anesthesia Post Note  Patient: Spencer Barber  Procedure(s) Performed: COLONOSCOPY WITH PROPOFOL (N/A )  Patient location during evaluation: PACU Anesthesia Type: General Level of consciousness: awake and alert Pain management: pain level controlled Vital Signs Assessment: post-procedure vital signs reviewed and stable Respiratory status: spontaneous breathing, nonlabored ventilation and respiratory function stable Cardiovascular status: blood pressure returned to baseline and stable Postop Assessment: no apparent nausea or vomiting Anesthetic complications: no   No complications documented.   Last Vitals:  Vitals:   12/06/20 1017 12/06/20 1020  BP: 117/77 125/85  Pulse: (!) 59 69  Resp: 14 (!) 22  Temp:    SpO2: 100% 100%    Last Pain:  Vitals:   12/07/20 0733  TempSrc:   PainSc: 0-No pain                 Karleen Hampshire

## 2020-12-17 ENCOUNTER — Ambulatory Visit: Payer: BC Managed Care – PPO | Admitting: Family

## 2020-12-24 LAB — HM DIABETES EYE EXAM

## 2021-01-07 ENCOUNTER — Ambulatory Visit: Payer: BC Managed Care – PPO | Admitting: Family

## 2021-01-07 ENCOUNTER — Encounter: Payer: Self-pay | Admitting: Family

## 2021-01-07 ENCOUNTER — Other Ambulatory Visit: Payer: Self-pay

## 2021-01-07 DIAGNOSIS — R809 Proteinuria, unspecified: Secondary | ICD-10-CM

## 2021-01-07 DIAGNOSIS — E119 Type 2 diabetes mellitus without complications: Secondary | ICD-10-CM | POA: Diagnosis not present

## 2021-01-07 DIAGNOSIS — E785 Hyperlipidemia, unspecified: Secondary | ICD-10-CM

## 2021-01-07 LAB — POCT GLYCOSYLATED HEMOGLOBIN (HGB A1C): Hemoglobin A1C: 6.9 % — AB (ref 4.0–5.6)

## 2021-01-07 MED ORDER — LISINOPRIL 5 MG PO TABS: 2.5000 mg | ORAL_TABLET | Freq: Every day | ORAL | 3 refills | Status: DC

## 2021-01-07 MED ORDER — METFORMIN HCL 1000 MG PO TABS: ORAL_TABLET | ORAL | 2 refills | Status: DC

## 2021-01-07 MED ORDER — TRULICITY 1.5 MG/0.5ML ~~LOC~~ SOAJ: 1.5000 mg | SUBCUTANEOUS | 2 refills | Status: DC

## 2021-01-07 MED ORDER — ROSUVASTATIN CALCIUM 10 MG PO TABS: 10.0000 mg | ORAL_TABLET | Freq: Every day | ORAL | 0 refills | Status: DC

## 2021-01-07 NOTE — Patient Instructions (Signed)
Continue trulicity and we will increase at follow up if a1c hasnt reached 6.5 Labs in 4 weeks at [facility] ' Nice to see you!

## 2021-01-07 NOTE — Assessment & Plan Note (Signed)
Lab Results  Component Value Date   HGBA1C 6.9 (A) 01/07/2021   Significant improvement and congratulated patient on his hard work. Continue metformin 1000mg bid , trulicity 1.5mg . We may increase trulicity at follow up. Advised to resume lisinopril 2.5mg  due to proteinuria, may increase as well once repeat UA returns in one month.

## 2021-01-07 NOTE — Assessment & Plan Note (Signed)
Stable.   - Continue crestor 10mg

## 2021-01-07 NOTE — Progress Notes (Signed)
Subjective:    Patient ID: Spencer Barber, male    DOB: [birth year], 50 y.o.   MRN: [id number]  CC: Spencer Barber is a 50 y.o. male who presents today for follow up.   HPI: He feels well today No complaints.   He has been eating healthier and completed month of vegetables. No fruit juice. He has lost 11 lbs   DM- 6.9 compliant with metformin 1000mg  bid, trulicity 1.5mg  .   Proteinuria-had been on lisinopril 2.5mg  however ran out several days ago   HLD- compliant with crestor 10mg     Declines pneumonia and tdap HISTORY:  Past Medical History:  Diagnosis Date  . Diabetes mellitus without complication (HCC)   . Hypercholesteremia   . Hypertension    Past Surgical History:  Procedure Laterality Date  . COLONOSCOPY WITH PROPOFOL N/A 12/06/2020   Procedure: COLONOSCOPY WITH PROPOFOL;  Surgeon: , MD;  Location: [facility] ENDOSCOPY;  Service: Gastroenterology;  Laterality: N/A;  . WISDOM TOOTH EXTRACTION     Family History  Problem Relation Age of Onset  . Diabetes Mother   . Alcohol abuse Father   . Colon cancer Maternal Great-grandmother   . Thyroid cancer Neg Hx     Allergies: Patient has no known allergies. Current Outpatient Medications on File Prior to Visit  Medication Sig Dispense Refill  . meloxicam (MOBIC) 7.5 MG tablet Take 1 tablet (7.5 mg total) by mouth daily as needed for pain. 30 tablet 1  . Multiple Vitamins-Minerals (MENS MULTIVITAMIN PO) Take by mouth.     No current facility-administered medications on file prior to visit.    Social History   Tobacco Use  . Smoking status: Never Smoker  . Smokeless tobacco: Never Used  Vaping Use  . Vaping Use: Never used  Substance Use Topics  . Alcohol use: Yes  . Drug use: Never    Review of Systems  Constitutional: Negative for chills and fever.  Respiratory: Negative for cough.   Cardiovascular: Negative for chest pain and palpitations.  Gastrointestinal: Negative for nausea and  vomiting.      Objective:    BP 110/70   Pulse 68   Temp 98 F (36.7 C)   Ht 6\' 2"  (1.88 m)   Wt 198 lb (89.8 kg)   SpO2 96%   BMI 25.42 kg/m  BP Readings from Last 3 Encounters:  01/07/21 110/70  12/06/20 125/85  09/15/20 114/78   Wt Readings from Last 3 Encounters:  01/07/21 198 lb (89.8 kg)  12/06/20 202 lb (91.6 kg)  09/15/20 209 lb (94.8 kg)    Physical Exam Vitals reviewed.  Constitutional:      Appearance: He is well-developed and well-nourished.  Cardiovascular:     Rate and Rhythm: Regular rhythm.     Heart sounds: Normal heart sounds.  Pulmonary:     Effort: Pulmonary effort is normal. No respiratory distress.     Breath sounds: Normal breath sounds. No wheezing, rhonchi or rales.  Skin:    General: Skin is warm and dry.  Neurological:     Mental Status: He is alert.  Psychiatric:        Mood and Affect: Mood and affect normal.        Speech: Speech normal.        Behavior: Behavior normal.        Assessment & Plan:   Problem List Items Addressed This Visit      Endocrine   Type 2 diabetes mellitus without complication,  without long-term current use of insulin (HCC)    Lab Results  Component Value Date   HGBA1C 6.9 (A) 01/07/2021   Significant improvement and congratulated patient on his hard work. Continue metformin 1000mg bid , trulicity 1.5mg . We may increase trulicity at follow up. Advised to resume lisinopril 2.5mg  due to proteinuria, may increase as well once repeat UA returns in one month.      Relevant Medications   metFORMIN (GLUCOPHAGE) 1000 MG tablet   Other Relevant Orders   Microalbumin / creatinine urine ratio   Comprehensive metabolic panel   POCT HgB A1C (Completed)     Other   HLD (hyperlipidemia)    Stable. Continue crestor 10mg           I am having Spencer Barber "Cam" maintain his Multiple Vitamins-Minerals (MENS MULTIVITAMIN PO), meloxicam, and metFORMIN.   Meds ordered this encounter  Medications  .  metFORMIN (GLUCOPHAGE) 1000 MG tablet    Sig: TAKE 1 TABLET BY MOUTH TWICE DAILY WITH A MEAL FOR DIABETES    Dispense:  180 tablet    Refill:  2    Order Specific Question:   Supervising Provider    Answer:   [[id number]]    Return precautions given.   Risks, benefits, and alternatives of the medications and treatment plan prescribed today were discussed, and patient expressed understanding.   Education regarding symptom management and diagnosis given to patient on AVS.  Continue to follow with , FNP for routine health maintenance.   Sherlene Shams and I agreed with plan.   Allegra Grana, FNP

## 2021-01-29 ENCOUNTER — Other Ambulatory Visit
Admission: RE | Admit: 2021-01-29 | Discharge: 2021-01-29 | Disposition: A | Discharge status: Home / Self Care | Payer: BC Managed Care – PPO | Attending: Family | Admitting: Family

## 2021-01-29 DIAGNOSIS — E119 Type 2 diabetes mellitus without complications: Secondary | ICD-10-CM | POA: Insufficient documentation

## 2021-01-29 LAB — COMPREHENSIVE METABOLIC PANEL
ALT: 18 U/L (ref 0–44)
AST: 20 U/L (ref 15–41)
Albumin: 4.4 g/dL (ref 3.5–5.0)
Alkaline Phosphatase: 74 U/L (ref 38–126)
Anion gap: 7 (ref 5–15)
BUN: 14 mg/dL (ref 6–20)
CO2: 28 mmol/L (ref 22–32)
Calcium: 9.8 mg/dL (ref 8.9–10.3)
Chloride: 104 mmol/L (ref 98–111)
Creatinine, Ser: 0.93 mg/dL (ref 0.61–1.24)
GFR, Estimated: >60 mL/min (ref >60)
Glucose, Bld: 125 mg/dL — ABNORMAL HIGH (ref 70–99)
Potassium: 4.5 mmol/L (ref 3.5–5.1)
Sodium: 139 mmol/L (ref 135–145)
Total Bilirubin: 1 mg/dL (ref 0.3–1.2)
Total Protein: 7.1 g/dL (ref 6.5–8.1)

## 2021-01-31 ENCOUNTER — Other Ambulatory Visit: Payer: Self-pay

## 2021-01-31 DIAGNOSIS — I10 Essential (primary) hypertension: Secondary | ICD-10-CM

## 2021-01-31 DIAGNOSIS — R809 Proteinuria, unspecified: Secondary | ICD-10-CM

## 2021-01-31 LAB — MICROALBUMIN / CREATININE URINE RATIO
Creatinine, Urine: 77.1 mg/dL
Microalb Creat Ratio: 8 mg/g creat (ref 0–29)
Microalb, Ur: 6 ug/mL — ABNORMAL HIGH

## 2021-01-31 MED ORDER — LISINOPRIL 5 MG PO TABS: 5.0000 mg | ORAL_TABLET | Freq: Every day | ORAL | 3 refills | Status: DC

## 2021-02-06 ENCOUNTER — Other Ambulatory Visit
Admission: RE | Admit: 2021-02-06 | Discharge: 2021-02-06 | Disposition: A | Discharge status: Home / Self Care | Payer: BC Managed Care – PPO | Attending: Family | Admitting: Family

## 2021-02-06 DIAGNOSIS — I10 Essential (primary) hypertension: Secondary | ICD-10-CM | POA: Insufficient documentation

## 2021-02-06 LAB — BASIC METABOLIC PANEL
Anion gap: 8 (ref 5–15)
BUN: 11 mg/dL (ref 6–20)
CO2: 30 mmol/L (ref 22–32)
Calcium: 9.7 mg/dL (ref 8.9–10.3)
Chloride: 105 mmol/L (ref 98–111)
Creatinine, Ser: 0.91 mg/dL (ref 0.61–1.24)
GFR, Estimated: >60 mL/min (ref >60)
Glucose, Bld: 128 mg/dL — ABNORMAL HIGH (ref 70–99)
Potassium: 4.1 mmol/L (ref 3.5–5.1)
Sodium: 143 mmol/L (ref 135–145)

## 2021-03-06 ENCOUNTER — Other Ambulatory Visit: Payer: Self-pay | Admitting: Family

## 2021-04-01 ENCOUNTER — Ambulatory Visit: Payer: BC Managed Care – PPO | Admitting: Family

## 2021-04-01 ENCOUNTER — Encounter: Payer: Self-pay | Admitting: Family

## 2021-04-01 ENCOUNTER — Other Ambulatory Visit: Payer: Self-pay

## 2021-04-01 VITALS — BP 102/70 | HR 62 | Temp 97.9°F | Ht 74.0 in | Wt 199.6 lb

## 2021-04-01 DIAGNOSIS — E785 Hyperlipidemia, unspecified: Secondary | ICD-10-CM

## 2021-04-01 DIAGNOSIS — E119 Type 2 diabetes mellitus without complications: Secondary | ICD-10-CM | POA: Diagnosis not present

## 2021-04-01 DIAGNOSIS — M25562 Pain in left knee: Secondary | ICD-10-CM | POA: Diagnosis not present

## 2021-04-01 DIAGNOSIS — E1129 Type 2 diabetes mellitus with other diabetic kidney complication: Secondary | ICD-10-CM | POA: Diagnosis not present

## 2021-04-01 DIAGNOSIS — M545 Low back pain, unspecified: Secondary | ICD-10-CM | POA: Diagnosis not present

## 2021-04-01 DIAGNOSIS — R809 Proteinuria, unspecified: Secondary | ICD-10-CM

## 2021-04-01 MED ORDER — MELOXICAM 7.5 MG PO TABS: 7.5000 mg | ORAL_TABLET | Freq: Every day | ORAL | 1 refills | Status: DC | PRN

## 2021-04-01 NOTE — Assessment & Plan Note (Signed)
Presume improved, pending a1c. Continue metformin 1000mg bid , trulicity 1.5mg  and  lisinopril 5mg  due to proteinuria.

## 2021-04-01 NOTE — Patient Instructions (Signed)
Trial of mobic for low back pain  A couple of points in regards to meloxicam ( Mobic) -  This medication is not intended for daily , long term use. It is a potent anti inflammatory ( NSAID), and my intention is for you take as needed for moderate to severe pain. If you find yourself using daily, please let me know.   Please takes Mobic ( meloxicam) with FOOD since it is an anti-inflammatory as it can cause a GI bleed or ulcer. If you have a history of GI bleed or ulcer, please do NOT take.  Do no take over the counter aleve, motrin, advil, goody's powder for pain as they are also NSAIDs, and they are  in the same class as Mobic  Lastly, we will need to monitor kidney function while on Mobic, and if we were to see any decline in kidney function in the future, we would have to discontinue this medication.     Let me know if back pain doesn't resolve

## 2021-04-01 NOTE — Progress Notes (Signed)
Subjective:    Patient ID: Spencer Barber, male    DOB: Jun 18, 1971, 50 y.o.   MRN: [id number]  CC: Spencer Barber is a 50 y.o. male who presents today for follow up.   HPI: Complains of right low back pain x 6 days, unchanged. Onset after walking. Worse after long periods of standing Dull ache.  hasnt tried any medication for this.   No rash, fever, nausea, vomiting, trouble urinating, bowel movement, numbness/weakness in legs, hematuria, abdominal pain.   DM- he is not checking blood sugars. compliant with metformin 1000mg bid , trulicity 1.5mg . Compliant with lisinopril 5mg   HLD- crestor 10mg    HISTORY:  Past Medical History:  Diagnosis Date  . Diabetes mellitus without complication (HCC)   . Hypercholesteremia   . Hypertension    Past Surgical History:  Procedure Laterality Date  . COLONOSCOPY WITH PROPOFOL N/A 12/06/2020   Procedure: COLONOSCOPY WITH PROPOFOL;  Surgeon: , MD;  Location: [facility] ENDOSCOPY;  Service: Gastroenterology;  Laterality: N/A;  . WISDOM TOOTH EXTRACTION     Family History  Problem Relation Age of Onset  . Diabetes Mother   . Alcohol abuse Father   . Colon cancer Maternal Great-grandmother   . Thyroid cancer Neg Hx     Allergies: Patient has no known allergies. Current Outpatient Medications on File Prior to Visit  Medication Sig Dispense Refill  . lisinopril (ZESTRIL) 5 MG tablet Take 1 tablet (5 mg total) by mouth daily. 90 tablet 3  . metFORMIN (GLUCOPHAGE) 1000 MG tablet TAKE 1 TABLET BY MOUTH TWICE DAILY WITH A MEAL FOR DIABETES 180 tablet 2  . Multiple Vitamins-Minerals (MENS MULTIVITAMIN PO) Take by mouth.    . rosuvastatin (CRESTOR) 10 MG tablet Take 1 tablet (10 mg total) by mouth daily. 90 tablet 0  . TRULICITY 1.5 MG/0.5ML SOPN INJECT 1.5 MG INTO THE SKIN ONCE WEEKLY 12 mL 0   No current facility-administered medications on file prior to visit.    Social History   Tobacco Use  . Smoking status: Never Smoker   . Smokeless tobacco: Never Used  Vaping Use  . Vaping Use: Never used  Substance Use Topics  . Alcohol use: Yes  . Drug use: Never    Review of Systems  Constitutional: Negative for chills and fever.  HENT: Negative for congestion, ear pain, rhinorrhea, sinus pressure and sore throat.   Respiratory: Negative for cough, shortness of breath and wheezing.   Cardiovascular: Negative for chest pain and palpitations.  Gastrointestinal: Negative for diarrhea, nausea and vomiting.  Genitourinary: Negative for dysuria.  Musculoskeletal: Positive for back pain. Negative for myalgias.  Skin: Negative for rash.  Neurological: Negative for headaches.  Hematological: Negative for adenopathy.      Objective:    BP 102/70 (BP Location: Left Arm, Patient Position: Sitting, Cuff Size: Large)   Pulse 62   Temp 97.9 F (36.6 C) (Oral)   Ht 6\' 2"  (1.88 m)   Wt 199 lb 9.6 oz (90.5 kg)   SpO2 99%   BMI 25.63 kg/m  BP Readings from Last 3 Encounters:  04/01/21 102/70  01/07/21 110/70  12/06/20 125/85   Wt Readings from Last 3 Encounters:  04/01/21 199 lb 9.6 oz (90.5 kg)  01/07/21 198 lb (89.8 kg)  12/06/20 202 lb (91.6 kg)    Physical Exam Vitals reviewed.  Constitutional:      Appearance: He is well-developed.  Cardiovascular:     Rate and Rhythm: Regular rhythm.     Heart  sounds: Normal heart sounds.  Pulmonary:     Effort: Pulmonary effort is normal. No respiratory distress.     Breath sounds: Normal breath sounds. No wheezing or rales.  Musculoskeletal:     Lumbar back: No swelling, spasms or tenderness. Normal range of motion.     Comments: Full range of motion with flexion, extension, lateral side bends. No pain, numbness, tingling elicited with single leg raise bilaterally. No rash.  Skin:    General: Skin is warm and dry.  Neurological:     Mental Status: He is alert.  Psychiatric:        Speech: Speech normal.        Behavior: Behavior normal.        Assessment  & Plan:   Problem List Items Addressed This Visit      Endocrine   DM (diabetes mellitus) (HCC)   Relevant Orders   Microalbumin / creatinine urine ratio   Hemoglobin A1c   Type 2 diabetes mellitus without complication, without long-term current use of insulin (HCC)    Presume improved, pending a1c. Continue metformin 1000mg bid , trulicity 1.5mg  and  lisinopril 5mg  due to proteinuria.         Other   HLD (hyperlipidemia)    Controlled. Continue crestor 10mg .       Left knee pain   Right low back pain - Primary    Acute. Benign exam. Will start with conservative therapy including mobic, heat. Declines low back imaging today. Pending UA to ensure no hematuria. He will let me know if doesn't resolve.        Relevant Medications   meloxicam (MOBIC) 7.5 MG tablet   Other Relevant Orders   Urinalysis, Routine w reflex microscopic       I am having Spencer Barber "Cam" maintain his Multiple Vitamins-Minerals (MENS MULTIVITAMIN PO), metFORMIN, rosuvastatin, lisinopril, Trulicity, and meloxicam.   Meds ordered this encounter  Medications  . meloxicam (MOBIC) 7.5 MG tablet    Sig: Take 1 tablet (7.5 mg total) by mouth daily as needed for pain.    Dispense:  30 tablet    Refill:  1    Order Specific Question:   Supervising Provider    Answer:   [[id number]]    Return precautions given.   Risks, benefits, and alternatives of the medications and treatment plan prescribed today were discussed, and patient expressed understanding.   Education regarding symptom management and diagnosis given to patient on AVS.  Continue to follow with , FNP for routine health maintenance.   and I agreed with plan.   Sherlene Shams, FNP

## 2021-04-01 NOTE — Assessment & Plan Note (Signed)
Controlled. Continue crestor 10mg 

## 2021-04-01 NOTE — Assessment & Plan Note (Addendum)
Acute. Benign exam. Will start with conservative therapy including mobic, heat. Declines low back imaging today. Pending UA to ensure no hematuria. He will let me know if doesn't resolve.

## 2021-04-06 ENCOUNTER — Ambulatory Visit: Payer: BC Managed Care – PPO | Admitting: Family

## 2021-04-08 ENCOUNTER — Other Ambulatory Visit: Payer: Self-pay

## 2021-04-08 ENCOUNTER — Other Ambulatory Visit (INDEPENDENT_AMBULATORY_CARE_PROVIDER_SITE_OTHER): Payer: BC Managed Care – PPO

## 2021-04-08 DIAGNOSIS — R809 Proteinuria, unspecified: Secondary | ICD-10-CM

## 2021-04-08 DIAGNOSIS — E1129 Type 2 diabetes mellitus with other diabetic kidney complication: Secondary | ICD-10-CM | POA: Diagnosis not present

## 2021-04-08 LAB — HEMOGLOBIN A1C: Hgb A1c MFr Bld: 7.5 % — ABNORMAL HIGH (ref 4.6–6.5)

## 2021-04-12 ENCOUNTER — Other Ambulatory Visit: Payer: Self-pay | Admitting: Family

## 2021-04-12 DIAGNOSIS — E119 Type 2 diabetes mellitus without complications: Secondary | ICD-10-CM

## 2021-04-12 MED ORDER — TRULICITY 1.5 MG/0.5ML ~~LOC~~ SOAJ: 3.0000 mg | SUBCUTANEOUS | 3 refills | Status: DC

## 2021-04-18 ENCOUNTER — Other Ambulatory Visit: Payer: Self-pay | Admitting: Family

## 2021-05-10 ENCOUNTER — Telehealth: Payer: Self-pay | Admitting: Family

## 2021-05-10 DIAGNOSIS — M545 Low back pain, unspecified: Secondary | ICD-10-CM

## 2021-05-10 NOTE — Telephone Encounter (Signed)
I called patient & let him know UA ordered for the hospital. He stated that he would rather just do when he is here in September. Pt stated that he was doing well.

## 2021-05-10 NOTE — Telephone Encounter (Signed)
Okay - please add to pt visit note to collect UA

## 2021-05-10 NOTE — Telephone Encounter (Signed)
Added

## 2021-05-10 NOTE — Telephone Encounter (Signed)
Call pt Reviewing chart and realized UA was never collected when we discussed back pain I have ordered to be done at [facility] in the next 1-2 week

## 2021-06-02 ENCOUNTER — Encounter: Payer: Self-pay | Admitting: Family

## 2021-06-03 ENCOUNTER — Other Ambulatory Visit: Payer: Self-pay

## 2021-06-06 ENCOUNTER — Other Ambulatory Visit: Payer: Self-pay

## 2021-06-06 MED ORDER — TRULICITY 3 MG/0.5ML ~~LOC~~ SOAJ: 3.0000 mg | SUBCUTANEOUS | 1 refills | Status: DC

## 2021-06-06 NOTE — Telephone Encounter (Signed)
I spoke with patient after consulting Spencer Barber, that pens are single dose. I have now sent in the 3mg  pens of Trulicity, but advised that he could use the 1.5mg  dose take two doses once a week to use up. I asked if any issues to call our office.

## 2021-07-08 ENCOUNTER — Ambulatory Visit: Payer: BC Managed Care – PPO | Admitting: Family

## 2021-07-15 ENCOUNTER — Ambulatory Visit: Payer: BC Managed Care – PPO | Admitting: Family

## 2021-07-15 ENCOUNTER — Other Ambulatory Visit: Payer: Self-pay

## 2021-07-15 ENCOUNTER — Other Ambulatory Visit: Payer: Self-pay | Admitting: Family

## 2021-07-15 ENCOUNTER — Encounter: Payer: Self-pay | Admitting: Family

## 2021-07-15 VITALS — BP 92/68 | HR 66 | Temp 98.2°F | Ht 74.0 in | Wt 199.8 lb

## 2021-07-15 DIAGNOSIS — E785 Hyperlipidemia, unspecified: Secondary | ICD-10-CM

## 2021-07-15 DIAGNOSIS — Z23 Encounter for immunization: Secondary | ICD-10-CM

## 2021-07-15 DIAGNOSIS — I10 Essential (primary) hypertension: Secondary | ICD-10-CM | POA: Diagnosis not present

## 2021-07-15 DIAGNOSIS — E119 Type 2 diabetes mellitus without complications: Secondary | ICD-10-CM

## 2021-07-15 DIAGNOSIS — R7989 Other specified abnormal findings of blood chemistry: Secondary | ICD-10-CM

## 2021-07-15 DIAGNOSIS — R200 Anesthesia of skin: Secondary | ICD-10-CM

## 2021-07-15 LAB — POCT GLYCOSYLATED HEMOGLOBIN (HGB A1C): Hemoglobin A1C: 6.7 % — AB (ref 4.0–5.6)

## 2021-07-15 LAB — MICROALBUMIN / CREATININE URINE RATIO
Creatinine,U: 128.3 mg/dL
Microalb Creat Ratio: 0.8 mg/g (ref 0.0–30.0)
Microalb, Ur: 1 mg/dL (ref 0.0–1.9)

## 2021-07-15 LAB — B12 AND FOLATE PANEL
Folate: 14.7 ng/mL (ref >5.9)
Vitamin B-12: 295 pg/mL (ref 211–911)

## 2021-07-15 LAB — TSH: TSH: 0.29 u[IU]/mL — ABNORMAL LOW (ref 0.35–5.50)

## 2021-07-15 NOTE — Assessment & Plan Note (Signed)
Chronic, controlled.  Continue Crestor 10 mg 

## 2021-07-15 NOTE — Assessment & Plan Note (Signed)
Reassuring exam today.  Discussed differentials including B12 deficiency, compression of the ulnar nerve, peripheral neuropathy as it relates to diabetes.  Advised he may use topical Voltaren gel or purchase carpal tunnel wrist brace to see if symptoms were to improve.  Advised to let me know if symptoms were to become more frequent or more bothersome as it relates to quality of life so we may examined more thoroughly.

## 2021-07-15 NOTE — Assessment & Plan Note (Signed)
Stable, excellent control.  Continue lisinopril 5 mg

## 2021-07-15 NOTE — Progress Notes (Signed)
Subjective:    Patient ID: Spencer Barber, male    DOB: 08/30/1971, 50 y.o.   MRN: [id number]  CC: Raziel Koenigs is a 50 y.o. male who presents today for follow up.   HPI: He complains of peripheral numbness occasionally in left 3rd finger for weeks, unchanged. Episodic.  No numbness in left arm.   Symptom exacerbated when working when on knees or when arm is flexed.  No neck pain, joint swelling, rash, discoloration, leg swelling, pain in calves. No numbness in toes.    DM-compliant with metformin 1000 mg twice daily, Trulicity 1.5 mg and lisinopril 5 mg  Hyperlipidemia-compliant with Crestor 10 mg HISTORY:  Past Medical History:  Diagnosis Date   Diabetes mellitus without complication (HCC)    Hypercholesteremia    Hypertension    Past Surgical History:  Procedure Laterality Date   COLONOSCOPY WITH PROPOFOL N/A 12/06/2020   Procedure: COLONOSCOPY WITH PROPOFOL;  Surgeon: Toney Reil, MD;  Location: [facility] ENDOSCOPY;  Service: Gastroenterology;  Laterality: N/A;   WISDOM TOOTH EXTRACTION     Family History  Problem Relation Age of Onset   Diabetes Mother    Alcohol abuse Father    Colon cancer Maternal Great-grandmother    Thyroid cancer Neg Hx     Allergies: Patient has no known allergies. Current Outpatient Medications on File Prior to Visit  Medication Sig Dispense Refill   Dulaglutide (TRULICITY) 3 MG/0.5ML SOPN Inject 3 mg as directed once a week. 6 mL 1   lisinopril (ZESTRIL) 5 MG tablet Take 1 tablet (5 mg total) by mouth daily. 90 tablet 3   metFORMIN (GLUCOPHAGE) 1000 MG tablet TAKE 1 TABLET BY MOUTH TWICE DAILY WITH A MEAL FOR DIABETES 180 tablet 2   Multiple Vitamins-Minerals (MENS MULTIVITAMIN PO) Take by mouth.     rosuvastatin (CRESTOR) 10 MG tablet Take 1 tablet by mouth once daily 90 tablet 0   No current facility-administered medications on file prior to visit.    Social History   Tobacco Use   Smoking status: Never   Smokeless tobacco:  Never  Vaping Use   Vaping Use: Never used  Substance Use Topics   Alcohol use: Yes   Drug use: Never    Review of Systems  Constitutional:  Negative for chills and fever.  Respiratory:  Negative for cough.   Cardiovascular:  Negative for chest pain and palpitations.  Gastrointestinal:  Negative for nausea and vomiting.  Neurological:  Positive for numbness. Negative for weakness.     Objective:    BP 92/68 (BP Location: Left Arm, Patient Position: Sitting, Cuff Size: Large)   Pulse 66   Temp 98.2 F (36.8 C) (Oral)   Ht 6\' 2"  (1.88 m)   Wt 199 lb 12.8 oz (90.6 kg)   SpO2 97%   BMI 25.65 kg/m  BP Readings from Last 3 Encounters:  07/15/21 92/68  04/01/21 102/70  01/07/21 110/70   Wt Readings from Last 3 Encounters:  07/15/21 199 lb 12.8 oz (90.6 kg)  04/01/21 199 lb 9.6 oz (90.5 kg)  01/07/21 198 lb (89.8 kg)    Physical Exam Vitals reviewed.  Constitutional:      Appearance: He is well-developed.  Cardiovascular:     Rate and Rhythm: Regular rhythm.     Heart sounds: Normal heart sounds.  Pulmonary:     Effort: Pulmonary effort is normal. No respiratory distress.     Breath sounds: Normal breath sounds. No wheezing, rhonchi or rales.  Musculoskeletal:  Right wrist: Normal. No swelling or tenderness. Normal range of motion. Normal pulse.     Left wrist: Normal. No swelling or tenderness. Normal range of motion. Normal pulse.     Right hand: No swelling. Normal strength.     Left hand: No swelling. Normal strength.     Comments: Bilateral palpable radial pulses. negative Tinel and Phalen's exam.  Grip strength normal bilaterally .sensation intact bilaterally.    Skin:    General: Skin is warm and dry.  Neurological:     Mental Status: He is alert.  Psychiatric:        Speech: Speech normal.        Behavior: Behavior normal.       Assessment & Plan:   Problem List Items Addressed This Visit       Cardiovascular and Mediastinum   HTN (hypertension)     Stable, excellent control.  Continue lisinopril 5 mg        Endocrine   Type 2 diabetes mellitus without complication, without long-term current use of insulin (HCC)    Lab Results  Component Value Date   HGBA1C 6.7 (A) 07/15/2021  Excellent control, continue metformin 1000 mg twice daily, Trulicity 1.5 mg      Relevant Orders   POCT HgB A1C (Completed)   Microalbumin / creatinine urine ratio     Other   Hand numbness - Primary    Reassuring exam today.  Discussed differentials including B12 deficiency, compression of the ulnar nerve, peripheral neuropathy as it relates to diabetes.  Advised he may use topical Voltaren gel or purchase carpal tunnel wrist brace to see if symptoms were to improve.  Advised to let me know if symptoms were to become more frequent or more bothersome as it relates to quality of life so we may examined more thoroughly.      Relevant Orders   B12 and Folate Panel   TSH   HLD (hyperlipidemia)    Chronic, controlled.  Continue Crestor 10mg       Other Visit Diagnoses     Need for immunization against influenza       Relevant Orders   Flu Vaccine QUAD 80mo+IM (Fluarix, Fluzone & Alfiuria Quad PF) (Completed)        I have discontinued Naser Zukas "Cam"'s meloxicam. I am also having him maintain his Multiple Vitamins-Minerals (MENS MULTIVITAMIN PO), metFORMIN, lisinopril, rosuvastatin, and Trulicity.   No orders of the defined types were placed in this encounter.   Return precautions given.   Risks, benefits, and alternatives of the medications and treatment plan prescribed today were discussed, and patient expressed understanding.   Education regarding symptom management and diagnosis given to patient on AVS.  Continue to follow with 5mo, FNP for routine health maintenance.   Allegra Grana and I agreed with plan.   Darcus Austin, FNP

## 2021-07-15 NOTE — Assessment & Plan Note (Signed)
Lab Results  Component Value Date   HGBA1C 6.7 (A) 07/15/2021   Excellent control, continue metformin 1000 mg twice daily, Trulicity 1.5 mg

## 2021-07-23 ENCOUNTER — Other Ambulatory Visit: Payer: Self-pay | Admitting: Family

## 2021-08-12 ENCOUNTER — Other Ambulatory Visit: Payer: Self-pay

## 2021-08-12 ENCOUNTER — Encounter: Payer: Self-pay | Admitting: Family Medicine

## 2021-08-12 ENCOUNTER — Ambulatory Visit: Payer: BC Managed Care – PPO | Admitting: Family Medicine

## 2021-08-12 VITALS — BP 108/70 | HR 72 | Temp 98.3°F | Ht 74.0 in | Wt 204.0 lb

## 2021-08-12 DIAGNOSIS — M47816 Spondylosis without myelopathy or radiculopathy, lumbar region: Secondary | ICD-10-CM | POA: Diagnosis not present

## 2021-08-12 DIAGNOSIS — R7989 Other specified abnormal findings of blood chemistry: Secondary | ICD-10-CM

## 2021-08-12 DIAGNOSIS — M4302 Spondylolysis, cervical region: Secondary | ICD-10-CM | POA: Diagnosis not present

## 2021-08-12 DIAGNOSIS — E1169 Type 2 diabetes mellitus with other specified complication: Secondary | ICD-10-CM

## 2021-08-12 DIAGNOSIS — I10 Essential (primary) hypertension: Secondary | ICD-10-CM | POA: Diagnosis not present

## 2021-08-12 DIAGNOSIS — Z1329 Encounter for screening for other suspected endocrine disorder: Secondary | ICD-10-CM | POA: Diagnosis not present

## 2021-08-12 MED ORDER — DICLOFENAC SODIUM 75 MG PO TBEC: 75.0000 mg | DELAYED_RELEASE_TABLET | Freq: Two times a day (BID) | ORAL | 0 refills | Status: DC

## 2021-08-12 MED ORDER — CYCLOBENZAPRINE HCL 10 MG PO TABS
10.0000 mg | ORAL_TABLET | Freq: Every evening | ORAL | 0 refills | Status: DC | PRN
Start: 2021-08-12 — End: 2021-09-16
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
## 2021-08-12 NOTE — Assessment & Plan Note (Signed)
Noted on recent serum studies, repeat TSH, free T4 and T3 ordered, we will follow these results once available.

## 2021-08-12 NOTE — Assessment & Plan Note (Signed)
Well-controlled today, currently on lisinopril 5 mg daily.  Did discuss the additional benefit of lisinopril in the setting of diabetes mellitus type 2.
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
## 2021-08-12 NOTE — Patient Instructions (Addendum)
-   Obtain labs with orders provided - Start diclofenac every a.m. and every p.m. scheduled (take with food) x2 weeks - After 2 weeks and dose diclofenac twice daily on an as-needed basis - Dose cyclobenzaprine (muscle relaxer) nightly on an as-needed basis for muscle tightness pain - Contact number below for physical therapy - Return for follow-up in 5 weeks, contact us for questions between now and then

## 2021-08-13 LAB — TSH+T4F+T3FREE
Free T4: 1.27 ng/dL (ref 0.82–1.77)
T3, Free: 2.8 pg/mL (ref 2.0–4.4)
TSH: 0.241 u[IU]/mL — ABNORMAL LOW (ref 0.450–4.500)

## 2021-08-15 ENCOUNTER — Other Ambulatory Visit: Payer: Self-pay | Admitting: Family Medicine

## 2021-08-15 ENCOUNTER — Encounter: Payer: Self-pay | Admitting: Family Medicine

## 2021-08-15 DIAGNOSIS — R7989 Other specified abnormal findings of blood chemistry: Secondary | ICD-10-CM

## 2021-08-15 NOTE — Progress Notes (Signed)
Spencer Barber, the repeat labs continue to show some abnormalities with your thyroid numbers, I have ordered an additional lab test and an ultrasound study of the thyroid. Someone from scheduling will contact you to coordinate this. Please reach out for questions.

## 2021-08-15 NOTE — Telephone Encounter (Signed)
Unsure what this is referring to.  Patient was advised of labs as being abnormal under result notes.  Is there anything additional that can be said?  I will place an order for an ultrasound for further evaluation as directed by you.

## 2021-08-16 NOTE — Telephone Encounter (Signed)
Spoke to patient over phone this AM to reiterate plan and answer questions. He is amenable to labs and imaging.

## 2021-08-23 ENCOUNTER — Ambulatory Visit: Payer: BC Managed Care – PPO

## 2021-08-26 ENCOUNTER — Other Ambulatory Visit: Payer: Self-pay

## 2021-08-26 ENCOUNTER — Ambulatory Visit
Admission: RE | Admit: 2021-08-26 | Discharge: 2021-08-26 | Disposition: A | Discharge status: Home / Self Care | Payer: BC Managed Care – PPO | Source: Ambulatory Visit | Attending: Family Medicine | Admitting: Family Medicine

## 2021-08-26 DIAGNOSIS — R946 Abnormal results of thyroid function studies: Secondary | ICD-10-CM | POA: Diagnosis not present

## 2021-08-26 DIAGNOSIS — R7989 Other specified abnormal findings of blood chemistry: Secondary | ICD-10-CM

## 2021-09-12 DIAGNOSIS — E3452 Partial androgen insensitivity syndrome: Secondary | ICD-10-CM | POA: Diagnosis not present

## 2021-09-12 DIAGNOSIS — E119 Type 2 diabetes mellitus without complications: Secondary | ICD-10-CM | POA: Diagnosis not present

## 2021-09-12 DIAGNOSIS — R946 Abnormal results of thyroid function studies: Secondary | ICD-10-CM | POA: Diagnosis not present

## 2021-09-12 DIAGNOSIS — R7989 Other specified abnormal findings of blood chemistry: Secondary | ICD-10-CM | POA: Diagnosis not present

## 2021-09-13 LAB — THYROTROPIN RECEPTOR AUTOABS: Thyrotropin Receptor Ab: <1.1 IU/L (ref 0.00–1.75)

## 2021-09-16 ENCOUNTER — Ambulatory Visit: Payer: BC Managed Care – PPO | Admitting: Family Medicine

## 2021-09-16 ENCOUNTER — Encounter: Payer: Self-pay | Admitting: Family Medicine

## 2021-09-16 ENCOUNTER — Other Ambulatory Visit: Payer: Self-pay

## 2021-09-16 VITALS — BP 92/64 | HR 73 | Temp 98.5°F | Ht 74.0 in | Wt 204.0 lb

## 2021-09-16 DIAGNOSIS — M4302 Spondylolysis, cervical region: Secondary | ICD-10-CM | POA: Diagnosis not present

## 2021-09-16 DIAGNOSIS — M47816 Spondylosis without myelopathy or radiculopathy, lumbar region: Secondary | ICD-10-CM | POA: Diagnosis not present

## 2021-09-16 DIAGNOSIS — N529 Male erectile dysfunction, unspecified: Secondary | ICD-10-CM

## 2021-09-16 DIAGNOSIS — R7989 Other specified abnormal findings of blood chemistry: Secondary | ICD-10-CM | POA: Diagnosis not present

## 2021-09-16 MED ORDER — TIZANIDINE HCL 4 MG PO TABS: 4.0000 mg | ORAL_TABLET | Freq: Four times a day (QID) | ORAL | 0 refills | Status: DC | PRN

## 2021-09-16 MED ORDER — MELOXICAM 15 MG PO TABS: 15.0000 mg | ORAL_TABLET | Freq: Every day | ORAL | 0 refills | Status: DC | PRN

## 2021-09-16 NOTE — Patient Instructions (Addendum)
-   Stop previous NSAID and muscle relaxer - Start new medications - Contact if interested in PT - Return in 6 weeks

## 2021-09-16 NOTE — Progress Notes (Signed)
Primary Care / Sports Medicine Office Visit  Patient Information:  Patient ID: Spencer Barber, male DOB: [birth year] Age: 50 y.o. MRN: [id number]   Spencer Barber is a pleasant 50 y.o. male presenting with the following:  Chief Complaint  Patient presents with   Follow-up    Review of Systems pertinent details above   Patient Active Problem List   Diagnosis Date Noted   Abnormal thyroid stimulating hormone (TSH) level 08/12/2021   Cervical spondylolysis 08/12/2021   Lumbar spondylosis 08/12/2021   Hand numbness 07/15/2021   Left knee pain 09/15/2020   HTN (hypertension) 08/23/2020   HLD (hyperlipidemia) 08/23/2020   Pneumococcal vaccine refused 08/03/2016   Reduced libido 04/03/2014   Infertile male syndrome 03/31/2014   Erectile dysfunction 03/31/2014   Routine physical examination 03/31/2014   Vaccination delay 12/31/2013   Diabetes mellitus ([place]) 12/31/2013   Past Medical History:  Diagnosis Date   Diabetes mellitus without complication ([place])    Hyperlipidemia    Hypertension    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   lisinopril (ZESTRIL) 5 MG tablet Take 1 tablet (5 mg total) by mouth daily.   meloxicam (MOBIC) 15 MG tablet Take 1 tablet (15 mg total) by mouth daily as needed for pain.   metFORMIN (GLUCOPHAGE) 1000 MG tablet TAKE 1 TABLET BY MOUTH TWICE DAILY WITH A MEAL FOR DIABETES   rosuvastatin (CRESTOR) 10 MG tablet Take 1 tablet by mouth once daily   tiZANidine (ZANAFLEX) 4 MG tablet Take 1 tablet (4 mg total) by mouth every 6 (six) hours as needed for muscle spasms.   [DISCONTINUED] diclofenac (VOLTAREN) 75 MG EC tablet Take 1 tablet (75 mg total) by mouth 2 (two) times daily.   [DISCONTINUED] cyclobenzaprine (FLEXERIL) 10 MG tablet Take 1 tablet (10 mg total) by mouth at bedtime as  needed for muscle spasms. (Patient not taking: Reported on 09/16/2021)   No facility-administered encounter medications on file as of 09/16/2021.   Past Surgical History:  Procedure Laterality Date   COLONOSCOPY WITH PROPOFOL N/A 12/06/2020   Procedure: COLONOSCOPY WITH PROPOFOL;  Surgeon: Lin Landsman, MD;  Location: [facility] ENDOSCOPY;  Service: Gastroenterology;  Laterality: N/A;   WISDOM TOOTH EXTRACTION      Vitals:   09/16/21 0802  BP: 92/64  Pulse: 73  Temp: 98.5 F (36.9 C)  SpO2: 99%   Vitals:   09/16/21 0802  Weight: 204 lb (92.5 kg)  Height: 6\' 2"  (1.88 m)   Body mass index is 26.19 kg/m.  US THYROID  Result Date: 08/26/2021 CLINICAL DATA:  Low TSH EXAM: THYROID ULTRASOUND TECHNIQUE: Ultrasound examination of the thyroid gland and adjacent soft tissues was performed. COMPARISON:  None. FINDINGS: Parenchymal Echotexture: Mildly heterogenous Isthmus: 4 mm Right lobe: 6.5 x 1.3 x 2.2 cm Left lobe: 6.1 x 1.7 x 1.9 cm _________________________________________________________ Estimated total number of nodules >/= 1 cm: 0 Number of spongiform nodules >/=  2 cm not described below (TR1): 0 Number of mixed cystic and solid nodules >/= 1.5 cm not described below (TR2): 0 _________________________________________________________ No discrete nodules are seen within the thyroid gland. Normal vascularity. No regional adenopathy. IMPRESSION: Normal thyroid ultrasound for age The above is in keeping with the ACR TI-RADS recommendations - J Am Coll Radiol 2017;14:587-595. Electronically Signed   By: Jerilynn Mages.  Shick M.D.  On: 08/26/2021 16:35     Independent interpretation of notes and tests performed by another provider:   None  Procedures performed:   None  Pertinent History, Exam, Impression, and Recommendations:   Abnormal thyroid stimulating hormone (TSH) level Patient remains asymptomatic, subsequent thyroid serum tests and ultrasound returned benign.  At this stage we discussed  the nature of his previous findings, lack of symptoms, and need for routine follow-up.  We will recheck TFTs in 6 months time.  Cervical spondylolysis Patient with stated limited benefit from recent NSAID and muscle relaxer, I have reviewed various treatment strategies inclusive of transition/escalation of pharmacotherapy, formal physical therapy, referral to pain and spine.  At this stage she will consider PT, is amenable to medication change.  I did transition patient to meloxicam and tizanidine, he is to contact us if he considers formal PT, we will coordinate a follow-up in 6 weeks to reassess his symptomatology.  Lumbar spondylosis See additional assessment(s) for plan details.  Erectile dysfunction Chronic issue that patient has seen urology for in the past (2015).  I did review the varying etiologies behind his concern and he is actively working towards lifestyle changes and stress reduction.  A referral to urology can be placed at his discretion.   Orders & Medications Meds ordered this encounter  Medications   tiZANidine (ZANAFLEX) 4 MG tablet    Sig: Take 1 tablet (4 mg total) by mouth every 6 (six) hours as needed for muscle spasms.    Dispense:  30 tablet    Refill:  0   meloxicam (MOBIC) 15 MG tablet    Sig: Take 1 tablet (15 mg total) by mouth daily as needed for pain.    Dispense:  30 tablet    Refill:  0   No orders of the defined types were placed in this encounter.    Return in about 6 weeks (around 10/28/2021).     Jerrol Banana, MD   Primary Care Sports Medicine [facility]

## 2021-09-16 NOTE — Assessment & Plan Note (Signed)
See additional assessment(s) for plan details. 

## 2021-09-16 NOTE — Assessment & Plan Note (Signed)
Patient with stated limited benefit from recent NSAID and muscle relaxer, I have reviewed various treatment strategies inclusive of transition/escalation of pharmacotherapy, formal physical therapy, referral to pain and spine.  At this stage she will consider PT, is amenable to medication change.  I did transition patient to meloxicam and tizanidine, he is to contact us if he considers formal PT, we will coordinate a follow-up in 6 weeks to reassess his symptomatology.

## 2021-09-16 NOTE — Assessment & Plan Note (Signed)
Patient remains asymptomatic, subsequent thyroid serum tests and ultrasound returned benign.  At this stage we discussed the nature of his previous findings, lack of symptoms, and need for routine follow-up.  We will recheck TFTs in 6 months time.
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
## 2021-10-21 ENCOUNTER — Ambulatory Visit: Payer: BC Managed Care – PPO | Admitting: Family

## 2021-10-23 ENCOUNTER — Other Ambulatory Visit: Payer: Self-pay | Admitting: Family

## 2021-10-28 ENCOUNTER — Other Ambulatory Visit: Payer: Self-pay

## 2021-10-28 ENCOUNTER — Other Ambulatory Visit: Payer: Self-pay | Admitting: Family

## 2021-10-28 ENCOUNTER — Ambulatory Visit: Payer: BC Managed Care – PPO | Admitting: Family Medicine

## 2021-10-28 ENCOUNTER — Encounter: Payer: Self-pay | Admitting: Family Medicine

## 2021-10-28 ENCOUNTER — Other Ambulatory Visit: Payer: Self-pay | Admitting: Family Medicine

## 2021-10-28 VITALS — BP 104/74 | HR 74 | Ht 74.0 in | Wt 205.0 lb

## 2021-10-28 DIAGNOSIS — E119 Type 2 diabetes mellitus without complications: Secondary | ICD-10-CM | POA: Diagnosis not present

## 2021-10-28 DIAGNOSIS — M47816 Spondylosis without myelopathy or radiculopathy, lumbar region: Secondary | ICD-10-CM | POA: Diagnosis not present

## 2021-10-28 DIAGNOSIS — E785 Hyperlipidemia, unspecified: Secondary | ICD-10-CM

## 2021-10-28 DIAGNOSIS — M4302 Spondylolysis, cervical region: Secondary | ICD-10-CM

## 2021-10-28 MED ORDER — MELOXICAM 15 MG PO TABS: 15.0000 mg | ORAL_TABLET | Freq: Every day | ORAL | 0 refills | Status: DC | PRN

## 2021-10-28 MED ORDER — DULOXETINE HCL 30 MG PO CPEP: ORAL_CAPSULE | ORAL | 0 refills | Status: DC

## 2021-10-28 MED ORDER — METFORMIN HCL 1000 MG PO TABS: ORAL_TABLET | ORAL | 0 refills | Status: DC

## 2021-10-28 MED ORDER — TRULICITY 3 MG/0.5ML ~~LOC~~ SOAJ
3.0000 mg | SUBCUTANEOUS | 2 refills | Status: DC
Start: 2021-10-28 — End: 2022-02-03

## 2021-10-28 MED ORDER — LISINOPRIL 5 MG PO TABS: 5.0000 mg | ORAL_TABLET | Freq: Every day | ORAL | 0 refills | Status: DC

## 2021-10-28 MED ORDER — ROSUVASTATIN CALCIUM 10 MG PO TABS: 10.0000 mg | ORAL_TABLET | Freq: Every day | ORAL | 0 refills | Status: DC

## 2021-10-28 MED ORDER — TIZANIDINE HCL 4 MG PO TABS: 4.0000 mg | ORAL_TABLET | Freq: Every evening | ORAL | 0 refills | Status: DC | PRN

## 2021-10-28 NOTE — Assessment & Plan Note (Signed)
Chronic condition that is stable. See additional assessment(s) for plan details.

## 2021-10-28 NOTE — Progress Notes (Signed)
Primary Care / Sports Medicine Office Visit  Patient Information:  Patient ID: Spencer Barber, male DOB: Nov 20, 1970 Age: 50 y.o. MRN: [id number]   Spencer Barber is a pleasant 50 y.o. male presenting with the following:  Chief Complaint  Patient presents with   Follow-up   Diabetes   Back Pain    Patient Active Problem List   Diagnosis Date Noted   Abnormal thyroid stimulating hormone (TSH) level 08/12/2021   Cervical spondylolysis 08/12/2021   Lumbar spondylosis 08/12/2021   Hand numbness 07/15/2021   Left knee pain 09/15/2020   HTN (hypertension) 08/23/2020   HLD (hyperlipidemia) 08/23/2020   Pneumococcal vaccine refused 08/03/2016   Reduced libido 04/03/2014   Infertile male syndrome 03/31/2014   Erectile dysfunction 03/31/2014   Routine physical examination 03/31/2014   Vaccination delay 12/31/2013   Diabetes mellitus (HCC) 12/31/2013    Vitals:   10/28/21 0751  BP: 104/74  Pulse: 74  SpO2: 98%   Vitals:   10/28/21 0751  Weight: 205 lb (93 kg)  Height: 6\' 2"  (1.88 m)   Body mass index is 26.32 kg/m.  No results found.   Independent interpretation of notes and tests performed by another provider:   None  Procedures performed:   None  Pertinent History, Exam, Impression, and Recommendations:   Diabetes mellitus (HCC) Chronic, stable condition, medications reviewed, determination for adjustments, and Rx sent x90 days.  Cervical spondylolysis Chronic condition with ongoing symptomatology, he is unavailable to formal physical therapy, is amenable to home-based rehab placed on a regular basis, plan for continued meloxicam, tizanidine, and we have initiated duloxetine at 30 mg, after 2 weeks he will titrate to 60 mg, and contact at the end of Rx for status update and potential refills.  Tolerating well, plan to refill at 60 mg capsules with enough quantity to last until this return visit in 3 months.  Lumbar spondylosis Chronic condition that is  stable. See additional assessment(s) for plan details.  HLD (hyperlipidemia) Chronic condition stable, lifestyle modifications reviewed including dietary and exercise recommendations.  Medication management performed, dosing adjustments considered, Rx sent for 90 days.   Orders & Medications Meds ordered this encounter  Medications   lisinopril (ZESTRIL) 5 MG tablet    Sig: Take 1 tablet (5 mg total) by mouth daily.    Dispense:  90 tablet    Refill:  0   rosuvastatin (CRESTOR) 10 MG tablet    Sig: Take 1 tablet (10 mg total) by mouth daily.    Dispense:  90 tablet    Refill:  0   metFORMIN (GLUCOPHAGE) 1000 MG tablet    Sig: TAKE 1 TABLET BY MOUTH TWICE DAILY WITH A MEAL FOR DIABETES    Dispense:  180 tablet    Refill:  0   Dulaglutide (TRULICITY) 3 MG/0.5ML SOPN    Sig: Inject 3 mg as directed once a week.    Dispense:  6 mL    Refill:  2   DULoxetine (CYMBALTA) 30 MG capsule    Sig: Take 1 capsule (30 mg total) by mouth every evening for 14 days, THEN 2 capsules (60 mg total) every evening for 16 days.    Dispense:  46 capsule    Refill:  0   meloxicam (MOBIC) 15 MG tablet    Sig: Take 1 tablet (15 mg total) by mouth daily as needed for pain.    Dispense:  90 tablet    Refill:  0   tiZANidine (ZANAFLEX)  4 MG tablet    Sig: Take 1 tablet (4 mg total) by mouth at bedtime as needed for muscle spasms.    Dispense:  90 tablet    Refill:  0   No orders of the defined types were placed in this encounter.    Return in about 3 months (around 01/26/2022).     Jerrol Banana, MD   Primary Care Sports Medicine [facility]

## 2021-10-28 NOTE — Assessment & Plan Note (Signed)
Chronic, stable condition, medications reviewed, determination for adjustments, and Rx sent x90 days.

## 2021-10-28 NOTE — Assessment & Plan Note (Signed)
Chronic condition with ongoing symptomatology, he is unavailable to formal physical therapy, is amenable to home-based rehab placed on a regular basis, plan for continued meloxicam, tizanidine, and we have initiated duloxetine at 30 mg, after 2 weeks he will titrate to 60 mg, and contact us at the end of Rx for status update and potential refills.  Tolerating well, plan to refill at 60 mg capsules with enough quantity to last until this return visit in 3 months.

## 2021-10-28 NOTE — Patient Instructions (Signed)
-   Start duloxetine, 1 capsule daily x2 weeks (30 mg total) - After 2 weeks, take 2 capsules duloxetine daily (60 mg total) - Contact us towards the end of your duloxetine prescription for status update and refills - Continue other medications daily - Can dose muscle relaxer (tizanidine) nightly on an as-needed basis - Start home exercises with information provided continue on a regular basis - Return for follow-up in 3 months

## 2021-10-28 NOTE — Assessment & Plan Note (Signed)
Chronic condition stable, lifestyle modifications reviewed including dietary and exercise recommendations.  Medication management performed, dosing adjustments considered, Rx sent for 90 days.

## 2021-10-29 NOTE — Telephone Encounter (Signed)
Requested Prescriptions  Pending Prescriptions Disp Refills   meloxicam (MOBIC) 15 MG tablet [Pharmacy Med Name: Meloxicam 15 MG Oral Tablet] 30 tablet 0    Sig: TAKE 1 TABLET BY MOUTH ONCE DAILY AS NEEDED FOR PAIN     Analgesics:  COX2 Inhibitors Failed - 10/28/2021  4:16 PM      Failed - HGB in normal range and within 360 days    Hemoglobin  Date Value Ref Range Status  07/21/2019 13.8 13.0 - 17.0 g/dL Final         Passed - Cr in normal range and within 360 days    Creatinine, Ser  Date Value Ref Range Status  02/06/2021 0.91 0.61 - 1.24 mg/dL Final   Creatinine,U  Date Value Ref Range Status  07/15/2021 128.3 mg/dL Final         Passed - Patient is not pregnant      Passed - Valid encounter within last 12 months    Recent Outpatient Visits          Yesterday Cervical spondylolysis   [facility] Jerrol Banana, MD   1 month ago Abnormal thyroid stimulating hormone (TSH) level   [facility] Jerrol Banana, MD   2 months ago Abnormal thyroid stimulating hormone (TSH) level   [facility] Jerrol Banana, MD      Future Appointments            In 3 months Ashley Royalty, Ocie Bob, MD [facility]

## 2021-10-31 ENCOUNTER — Encounter: Payer: Self-pay | Admitting: Family Medicine

## 2021-11-01 ENCOUNTER — Other Ambulatory Visit: Payer: Self-pay

## 2021-11-01 DIAGNOSIS — E119 Type 2 diabetes mellitus without complications: Secondary | ICD-10-CM

## 2021-11-01 DIAGNOSIS — E785 Hyperlipidemia, unspecified: Secondary | ICD-10-CM

## 2021-11-01 MED ORDER — METFORMIN HCL 1000 MG PO TABS: 1000.0000 mg | ORAL_TABLET | Freq: Two times a day (BID) | ORAL | 0 refills | Status: DC

## 2021-11-01 MED ORDER — ROSUVASTATIN CALCIUM 10 MG PO TABS: 10.0000 mg | ORAL_TABLET | Freq: Every day | ORAL | 0 refills | Status: DC

## 2021-11-23 ENCOUNTER — Other Ambulatory Visit: Payer: Self-pay | Admitting: Family

## 2021-11-23 ENCOUNTER — Other Ambulatory Visit: Payer: Self-pay | Admitting: Family Medicine

## 2021-11-23 DIAGNOSIS — M47816 Spondylosis without myelopathy or radiculopathy, lumbar region: Secondary | ICD-10-CM

## 2021-11-23 DIAGNOSIS — E119 Type 2 diabetes mellitus without complications: Secondary | ICD-10-CM

## 2021-11-23 DIAGNOSIS — M4302 Spondylolysis, cervical region: Secondary | ICD-10-CM

## 2021-11-23 NOTE — Telephone Encounter (Signed)
Written 10/28/2021 #90 Pt should have 60 day supply remaining.  Requested Prescriptions  Pending Prescriptions Disp Refills   meloxicam (MOBIC) 15 MG tablet [Pharmacy Med Name: Meloxicam 15 MG Oral Tablet] 30 tablet 0    Sig: TAKE 1 TABLET BY MOUTH ONCE DAILY AS NEEDED FOR PAIN     Analgesics:  COX2 Inhibitors Failed - 11/23/2021  4:22 AM      Failed - HGB in normal range and within 360 days    Hemoglobin  Date Value Ref Range Status  07/21/2019 13.8 13.0 - 17.0 g/dL Final         Passed - Cr in normal range and within 360 days    Creatinine, Ser  Date Value Ref Range Status  02/06/2021 0.91 0.61 - 1.24 mg/dL Final   Creatinine,U  Date Value Ref Range Status  07/15/2021 128.3 mg/dL Final         Passed - Patient is not pregnant      Passed - Valid encounter within last 12 months    Recent Outpatient Visits          3 weeks ago Cervical spondylolysis   [facility] Jerrol Banana, MD   2 months ago Abnormal thyroid stimulating hormone (TSH) level   [facility] Jerrol Banana, MD   3 months ago Abnormal thyroid stimulating hormone (TSH) level   [facility] Jerrol Banana, MD      Future Appointments            In 2 months Ashley Royalty, Ocie Bob, MD [facility]

## 2021-12-23 ENCOUNTER — Other Ambulatory Visit: Payer: Self-pay | Admitting: Family Medicine

## 2021-12-23 DIAGNOSIS — M47816 Spondylosis without myelopathy or radiculopathy, lumbar region: Secondary | ICD-10-CM

## 2021-12-23 DIAGNOSIS — M4302 Spondylolysis, cervical region: Secondary | ICD-10-CM

## 2021-12-23 NOTE — Telephone Encounter (Signed)
Requested medications are due for refill today.  Yes - a bit too soon  Requested medications are on the active medications list.  yes  Last refill. 10/28/2021 #90/ 0 refills  Future visit scheduled.   yes  Notes to clinic.  Failed protocol d/t missing labs.    Requested Prescriptions  Pending Prescriptions Disp Refills   meloxicam (MOBIC) 15 MG tablet [Pharmacy Med Name: Meloxicam 15 MG Oral Tablet] 30 tablet 0    Sig: TAKE 1 TABLET BY MOUTH ONCE DAILY AS NEEDED FOR PAIN     Analgesics:  COX2 Inhibitors Failed - 12/23/2021  2:56 AM      Failed - Manual Review: Labs are only required if the patient has taken medication for more than 8 weeks.      Failed - HGB in normal range and within 360 days    Hemoglobin  Date Value Ref Range Status  07/21/2019 13.8 13.0 - 17.0 g/dL Final          Failed - HCT in normal range and within 360 days    HCT  Date Value Ref Range Status  07/21/2019 40.2 39.0 - 52.0 % Final          Passed - Cr in normal range and within 360 days    Creatinine, Ser  Date Value Ref Range Status  02/06/2021 0.91 0.61 - 1.24 mg/dL Final   Creatinine,U  Date Value Ref Range Status  07/15/2021 128.3 mg/dL Final          Passed - AST in normal range and within 360 days    AST  Date Value Ref Range Status  01/29/2021 20 15 - 41 U/L Final          Passed - ALT in normal range and within 360 days    ALT  Date Value Ref Range Status  01/29/2021 18 0 - 44 U/L Final          Passed - eGFR is 30 or above and within 360 days    GFR calc Af Amer  Date Value Ref Range Status  12/03/2019 >60 >60 mL/min Final   GFR, Estimated  Date Value Ref Range Status  02/06/2021 >60 >60 mL/min Final    Comment:    (NOTE) Calculated using the CKD-EPI Creatinine Equation (2021)    GFR  Date Value Ref Range Status  11/04/2018 80.10 >60.00 mL/min Final          Passed - Patient is not pregnant      Passed - Valid encounter within last 12 months    Recent  Outpatient Visits           1 month ago Cervical spondylolysis   [facility] [name], MD   3 months ago Abnormal thyroid stimulating hormone (TSH) level   [facility] [name], MD   4 months ago Abnormal thyroid stimulating hormone (TSH) level   [facility] [name], MD       Future Appointments             In 1 month Zigmund Daniel, Earley Abide, MD [facility], [facility]

## 2022-01-22 ENCOUNTER — Other Ambulatory Visit: Payer: Self-pay | Admitting: Family

## 2022-01-22 DIAGNOSIS — E119 Type 2 diabetes mellitus without complications: Secondary | ICD-10-CM

## 2022-01-22 DIAGNOSIS — E785 Hyperlipidemia, unspecified: Secondary | ICD-10-CM

## 2022-01-27 ENCOUNTER — Ambulatory Visit: Payer: BC Managed Care – PPO | Admitting: Family Medicine

## 2022-02-03 ENCOUNTER — Ambulatory Visit: Payer: BC Managed Care – PPO | Admitting: Family Medicine

## 2022-02-03 ENCOUNTER — Encounter: Payer: Self-pay | Admitting: Family Medicine

## 2022-02-03 ENCOUNTER — Other Ambulatory Visit: Payer: Self-pay

## 2022-02-03 VITALS — BP 112/70 | HR 67 | Ht 74.0 in | Wt 205.0 lb

## 2022-02-03 DIAGNOSIS — M47816 Spondylosis without myelopathy or radiculopathy, lumbar region: Secondary | ICD-10-CM

## 2022-02-03 DIAGNOSIS — E785 Hyperlipidemia, unspecified: Secondary | ICD-10-CM | POA: Diagnosis not present

## 2022-02-03 DIAGNOSIS — E1169 Type 2 diabetes mellitus with other specified complication: Secondary | ICD-10-CM

## 2022-02-03 DIAGNOSIS — I10 Essential (primary) hypertension: Secondary | ICD-10-CM

## 2022-02-03 DIAGNOSIS — M4302 Spondylolysis, cervical region: Secondary | ICD-10-CM

## 2022-02-03 DIAGNOSIS — E119 Type 2 diabetes mellitus without complications: Secondary | ICD-10-CM

## 2022-02-03 MED ORDER — DULOXETINE HCL 30 MG PO CPEP: ORAL_CAPSULE | ORAL | 0 refills | Status: DC

## 2022-02-03 MED ORDER — MELOXICAM 15 MG PO TABS: 15.0000 mg | ORAL_TABLET | Freq: Every day | ORAL | 0 refills | Status: DC | PRN

## 2022-02-03 MED ORDER — TRULICITY 3 MG/0.5ML ~~LOC~~ SOAJ: 3.0000 mg | SUBCUTANEOUS | 2 refills | Status: DC

## 2022-02-03 MED ORDER — ROSUVASTATIN CALCIUM 10 MG PO TABS: 10.0000 mg | ORAL_TABLET | Freq: Every day | ORAL | 0 refills | Status: DC

## 2022-02-03 MED ORDER — METFORMIN HCL 1000 MG PO TABS: 1000.0000 mg | ORAL_TABLET | Freq: Two times a day (BID) | ORAL | 0 refills | Status: DC

## 2022-02-03 MED ORDER — LISINOPRIL 5 MG PO TABS: 5.0000 mg | ORAL_TABLET | Freq: Every day | ORAL | 0 refills | Status: DC

## 2022-02-03 NOTE — Assessment & Plan Note (Signed)
Chronic, stable condition, Rx refilled ?

## 2022-02-03 NOTE — Patient Instructions (Signed)
-   Obtain labs with orders provided ?-Take your regular medications ?- Our office will contact you with lab results once available ?- Dose of the Cymbalta (duloxetine), 1 capsule daily x2 weeks, after 2 weeks dose 2 capsules until Rx nearly complete ?- Contact our office before Rx complete so that we can refill at higher strength ?- MyChart virtual visit in 3 months ?- Contact us for any questions between now and then ?

## 2022-02-03 NOTE — Assessment & Plan Note (Signed)
Patient presents for follow-up to known lumbar spondylosis and associated low back pain, chronic in nature, ongoing for years.  In the past we have attempted varying medications and patient has reported intermittent response though has self discontinued these medications and is currently dosing meloxicam scheduled nightly with positive effect. ? ?Examination does show positive Marden Noble test localizing to the low left lumbar spine, negative straight leg raise bilaterally, nontender paraspinals musculature. ? ?I discussed the nature of his comorbid medical conditions such as diabetes, the goal of minimizing exposure to NSAIDs, and plan for duloxetine as he was unable to pursue the titration parameters.  He is amenable to revisiting this, these were discussed at length, questions answered.  He will start duloxetine 30 mg daily x2 weeks, after 2 weeks titrate to 60 mg, and contact us prior to running out of dose for new refill.  Over the interim he can continue meloxicam on as-needed basis.  I did offer physical therapy, however patient will consider this for the future. ? ?Chronic condition, symptomatic, Rx management ?

## 2022-02-03 NOTE — Progress Notes (Signed)
?  ? ?Primary Care / Sports Medicine Office Visit ? ?Patient Information:  ?Patient ID: Spencer Midgley, male DOB: 02-06-1971 Age: 51 y.o. MRN: [id number]  ? ?Spencer Barber is a pleasant 51 y.o. male presenting with the following: ? ?Chief Complaint  ?Patient presents with  ? Follow-up  ?  Wants A1C checked ?Lumbar spondylosis-medication is helping at night.  ?Type 2 diabetes mellitus- not sure of numbers hasn't been checking at home.   ? ? ?Vitals:  ? 02/03/22 0927  ?BP: 112/70  ?Pulse: 67  ?SpO2: 98%  ? ?Vitals:  ? 02/03/22 0927  ?Weight: 205 lb (93 kg)  ?Height: 6\' 2"  (1.88 m)  ? ?Body mass index is 26.32 kg/m?. ? ?No results found.  ? ?Independent interpretation of notes and tests performed by another provider:  ? ?None ? ?Procedures performed:  ? ?None ? ?Pertinent History, Exam, Impression, and Recommendations:  ? ?Diabetes mellitus ([place]) ?Presents for follow-up to diabetes mellitus type 2, has not checked in with [facility], interested in obtaining labs today.  Additional orders were placed, did discuss the possibility of medication changes pending lab results. ? ?HLD (hyperlipidemia) ?Chronic, stable condition, Rx refilled ? ?Lumbar spondylosis ?Patient presents for follow-up to known lumbar spondylosis and associated low back pain, chronic in nature, ongoing for years.  In the past we have attempted varying medications and patient has reported intermittent response though has self discontinued these medications and is currently dosing meloxicam scheduled nightly with positive effect. ? ?Examination does show positive Lynelle Smoke test localizing to the low left lumbar spine, negative straight leg raise bilaterally, nontender paraspinals musculature. ? ?I discussed the nature of his comorbid medical conditions such as diabetes, the goal of minimizing exposure to NSAIDs, and plan for duloxetine as he was unable to pursue the titration parameters.  He is amenable to revisiting this, these were discussed at  length, questions answered.  He will start duloxetine 30 mg daily x2 weeks, after 2 weeks titrate to 60 mg, and contact us prior to running out of dose for new refill.  Over the interim he can continue meloxicam on as-needed basis.  I did offer physical therapy, however patient will consider this for the future. ? ?Chronic condition, symptomatic, Rx management  ? ?Orders & Medications ?Meds ordered this encounter  ?Medications  ? Dulaglutide (TRULICITY) 3 0000000 SOPN  ?  Sig: Inject 3 mg as directed once a week.  ?  Dispense:  6 mL  ?  Refill:  2  ? lisinopril (ZESTRIL) 5 MG tablet  ?  Sig: Take 1 tablet (5 mg total) by mouth daily.  ?  Dispense:  90 tablet  ?  Refill:  0  ? metFORMIN (GLUCOPHAGE) 1000 MG tablet  ?  Sig: Take 1 tablet (1,000 mg total) by mouth 2 (two) times daily with a meal.  ?  Dispense:  180 tablet  ?  Refill:  0  ? rosuvastatin (CRESTOR) 10 MG tablet  ?  Sig: Take 1 tablet (10 mg total) by mouth daily.  ?  Dispense:  90 tablet  ?  Refill:  0  ? DULoxetine (CYMBALTA) 30 MG capsule  ?  Sig: Take 1 capsule (30 mg total) by mouth every evening for 14 days, THEN 2 capsules (60 mg total) every evening.  ?  Dispense:  78 capsule  ?  Refill:  0  ? meloxicam (MOBIC) 15 MG tablet  ?  Sig: Take 1 tablet (15 mg total) by mouth daily as needed for  pain.  ?  Dispense:  90 tablet  ?  Refill:  0  ? ?Orders Placed This Encounter  ?Procedures  ? Hemoglobin A1c  ? Basic metabolic panel  ?  ? ?Return in about 3 months (around 05/06/2022) for MyChart video visit.  ?  ? ?[name], MD ? ? Primary Care Sports Medicine ?[facility] ?[place]  ? ?

## 2022-02-03 NOTE — Assessment & Plan Note (Signed)
Presents for follow-up to diabetes mellitus type 2, has not checked in with [facility], interested in obtaining labs today.  Additional orders were placed, did discuss the possibility of medication changes pending lab results. ?

## 2022-02-04 LAB — BASIC METABOLIC PANEL
BUN/Creatinine Ratio: 16 (ref 9–20)
BUN: 14 mg/dL (ref 6–24)
CO2: 23 mmol/L (ref 20–29)
Calcium: 9.9 mg/dL (ref 8.7–10.2)
Chloride: 104 mmol/L (ref 96–106)
Creatinine, Ser: 0.89 mg/dL (ref 0.76–1.27)
Glucose: 153 mg/dL — ABNORMAL HIGH (ref 70–99)
Potassium: 4.9 mmol/L (ref 3.5–5.2)
Sodium: 141 mmol/L (ref 134–144)
eGFR: 104 mL/min/{1.73_m2} (ref >59)

## 2022-02-04 LAB — HEMOGLOBIN A1C
Est. average glucose Bld gHb Est-mCnc: 186 mg/dL
Hgb A1c MFr Bld: 8.1 % — ABNORMAL HIGH (ref 4.8–5.6)

## 2022-02-06 ENCOUNTER — Other Ambulatory Visit: Payer: Self-pay | Admitting: Family Medicine

## 2022-02-06 DIAGNOSIS — E119 Type 2 diabetes mellitus without complications: Secondary | ICD-10-CM

## 2022-02-06 MED ORDER — TRULICITY 4.5 MG/0.5ML ~~LOC~~ SOAJ: 4.5000 mg | SUBCUTANEOUS | 2 refills | Status: DC

## 2022-03-31 LAB — HM DIABETES EYE EXAM

## 2022-04-28 ENCOUNTER — Encounter: Payer: Self-pay | Admitting: Family Medicine

## 2022-04-28 ENCOUNTER — Telehealth (INDEPENDENT_AMBULATORY_CARE_PROVIDER_SITE_OTHER): Payer: BC Managed Care – PPO | Admitting: Family Medicine

## 2022-04-28 DIAGNOSIS — I10 Essential (primary) hypertension: Secondary | ICD-10-CM

## 2022-04-28 DIAGNOSIS — N529 Male erectile dysfunction, unspecified: Secondary | ICD-10-CM

## 2022-04-28 DIAGNOSIS — M4302 Spondylolysis, cervical region: Secondary | ICD-10-CM

## 2022-04-28 DIAGNOSIS — E785 Hyperlipidemia, unspecified: Secondary | ICD-10-CM

## 2022-04-28 DIAGNOSIS — M47816 Spondylosis without myelopathy or radiculopathy, lumbar region: Secondary | ICD-10-CM

## 2022-04-28 DIAGNOSIS — R7989 Other specified abnormal findings of blood chemistry: Secondary | ICD-10-CM

## 2022-04-28 DIAGNOSIS — E119 Type 2 diabetes mellitus without complications: Secondary | ICD-10-CM

## 2022-04-28 MED ORDER — DULOXETINE HCL 60 MG PO CPEP: 60.0000 mg | ORAL_CAPSULE | Freq: Every day | ORAL | 0 refills | Status: DC

## 2022-04-28 MED ORDER — LISINOPRIL 5 MG PO TABS: 5.0000 mg | ORAL_TABLET | Freq: Every day | ORAL | 0 refills | Status: DC

## 2022-04-28 MED ORDER — METFORMIN HCL 1000 MG PO TABS: 1000.0000 mg | ORAL_TABLET | Freq: Two times a day (BID) | ORAL | 0 refills | Status: DC

## 2022-04-28 MED ORDER — TRULICITY 4.5 MG/0.5ML ~~LOC~~ SOAJ: 4.5000 mg | SUBCUTANEOUS | 0 refills | Status: DC

## 2022-04-28 MED ORDER — ROSUVASTATIN CALCIUM 10 MG PO TABS: 10.0000 mg | ORAL_TABLET | Freq: Every day | ORAL | 0 refills | Status: DC

## 2022-04-28 MED ORDER — MELOXICAM 15 MG PO TABS: 15.0000 mg | ORAL_TABLET | Freq: Every day | ORAL | 0 refills | Status: DC | PRN

## 2022-04-28 NOTE — Patient Instructions (Signed)
-   Obtain fasting blood work with the lab orders provided - Continue all medications on a regular basis except for meloxicam - Meloxicam to be dosed sparingly for breakthrough neck/low back pain - Referral coordinator should contact you to schedule follow-ups with endocrinology and urology - Return for in person annual physical visit in 4 months

## 2022-04-28 NOTE — Progress Notes (Signed)
Primary Care / Sports Medicine Virtual Visit  Patient Information:  Patient ID: Spencer Barber, male DOB: 1971-10-11 Age: 51 y.o. MRN: [id number]   Spencer Barber is a pleasant 51 y.o. male presenting with the following:  Chief Complaint  Patient presents with   Follow-up    Pt would like some referrals.     Review of Systems: No fevers, chills, night sweats, weight loss, chest pain, or shortness of breath.   Patient Active Problem List   Diagnosis Date Noted   Abnormal thyroid stimulating hormone (TSH) level 08/12/2021   Cervical spondylolysis 08/12/2021   Lumbar spondylosis 08/12/2021   Hand numbness 07/15/2021   Left knee pain 09/15/2020   HTN (hypertension) 08/23/2020   HLD (hyperlipidemia) 08/23/2020   Pneumococcal vaccine refused 08/03/2016   Reduced libido 04/03/2014   Infertile male syndrome 03/31/2014   Erectile dysfunction 03/31/2014   Routine physical examination 03/31/2014   Vaccination delay 12/31/2013   Diabetes mellitus ([place]) 12/31/2013   Past Medical History:  Diagnosis Date   Diabetes mellitus without complication ([place])    Hyperlipidemia    Hypertension    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   [DISCONTINUED] Dulaglutide (TRULICITY) 4.5 0000000 SOPN Inject 4.5 mg into the skin once a week.   [DISCONTINUED] lisinopril (ZESTRIL) 5 MG tablet Take 1 tablet (5 mg total) by mouth daily.   [DISCONTINUED] meloxicam (MOBIC) 15 MG tablet Take 1 tablet (15 mg total) by mouth daily as needed for pain.   [DISCONTINUED] metFORMIN (GLUCOPHAGE) 1000 MG tablet Take 1 tablet (1,000 mg total) by mouth 2 (two) times daily with a meal.   [DISCONTINUED] rosuvastatin (CRESTOR) 10 MG tablet Take 1 tablet (10 mg total) by mouth daily.   Dulaglutide (TRULICITY) 4.5 0000000 SOPN Inject 4.5 mg into  the skin once a week.   lisinopril (ZESTRIL) 5 MG tablet Take 1 tablet (5 mg total) by mouth daily.   meloxicam (MOBIC) 15 MG tablet Take 1 tablet (15 mg total) by mouth daily as needed for pain.   metFORMIN (GLUCOPHAGE) 1000 MG tablet Take 1 tablet (1,000 mg total) by mouth 2 (two) times daily with a meal.   rosuvastatin (CRESTOR) 10 MG tablet Take 1 tablet (10 mg total) by mouth daily.   [DISCONTINUED] DULoxetine (CYMBALTA) 30 MG capsule Take 1 capsule (30 mg total) by mouth every evening for 14 days, THEN 2 capsules (60 mg total) every evening.   No facility-administered encounter medications on file as of 04/28/2022.   Past Surgical History:  Procedure Laterality Date   COLONOSCOPY WITH PROPOFOL N/A 12/06/2020   Procedure: COLONOSCOPY WITH PROPOFOL;  Surgeon: Lin Landsman, MD;  Location: [facility] ENDOSCOPY;  Service: Gastroenterology;  Laterality: N/A;   WISDOM TOOTH EXTRACTION      Virtual Visit via MyChart Video:   I connected with Spencer Barber on 04/28/22 via MyChart Video and verified that I am speaking with the correct person using appropriate identifiers.   The limitations, risks, security and privacy concerns of performing an evaluation and management service by MyChart Video, including the higher likelihood of inaccurate diagnoses and treatments, and the availability of in person appointments were reviewed. The possible need of an additional face-to-face encounter for complete and high quality delivery of care was discussed. The patient was also made aware that there may be a patient  responsible charge related to this service. The patient expressed understanding and wishes to proceed.  Provider location is in medical facility. Patient location is at their home, different from provider location. People involved in care of the patient during this telehealth encounter were myself, my nurse/medical assistant, and my front office/scheduling team member.  Objective findings:    General: Speaking full sentences, no audible heavy breathing. Sounds alert and appropriately interactive. Well-appearing. Face symmetric. Extraocular movements intact. Pupils equal and round. No nasal flaring or accessory muscle use visualized.  Independent interpretation of notes and tests performed by another provider:   None  Pertinent History, Exam, Impression, and Recommendations:   Erectile dysfunction Chronic issue for which patient continues to note symptomatology interfering with his quality of life.  He has tried sildenafil in the past which, while helping with achieving an erection, did not help with premature ejaculation and he had issues with timing/onset of action.  I did discuss the multifactorial nature of his concern over premature ejaculation.  He is requesting further evaluation by urology, this is reasonable given the significant interference endorsed.  Cervical spondylolysis Patient has successfully titrated duloxetine to 60 mg and has noted overall improvement in his cervical and lumbar chronic pain.  That being said he has been dosing meloxicam nightly not aware that this is an as needed medication.  Discussion on the specific role of both duloxetine and meloxicam and his musculoskeletal pain treatment was reviewed at length with questions answered.  He will transition to as needed dosing and understands that limited use of NSAIDs would be beneficial given his comorbid type 2 diabetes mellitus.  New Rx for duloxetine 60 mg sent for daily dosing.  Lumbar spondylosis See additional assessment(s) for plan details.  HLD (hyperlipidemia) Recheck of levels with new labs.  Diabetes mellitus ([facility]) Most recent A1c obtained during the 01/2022 timeframe showed increase, at that time increased metformin and Trulicity recommended.  Patient has been compliant with these changes, tolerating medication changes without issue.  Plan for recheck of A1c with new orders provided.   Additionally, patient interested in continuous blood glucose monitoring and further medication options.  A referral to endocrinology has been placed in that regard.  Orders & Medications Meds ordered this encounter  Medications   rosuvastatin (CRESTOR) 10 MG tablet    Sig: Take 1 tablet (10 mg total) by mouth daily.    Dispense:  90 tablet    Refill:  0   meloxicam (MOBIC) 15 MG tablet    Sig: Take 1 tablet (15 mg total) by mouth daily as needed for pain.    Dispense:  90 tablet    Refill:  0   metFORMIN (GLUCOPHAGE) 1000 MG tablet    Sig: Take 1 tablet (1,000 mg total) by mouth 2 (two) times daily with a meal.    Dispense:  180 tablet    Refill:  0   lisinopril (ZESTRIL) 5 MG tablet    Sig: Take 1 tablet (5 mg total) by mouth daily.    Dispense:  90 tablet    Refill:  0   Dulaglutide (TRULICITY) 4.5 MG/0.5ML SOPN    Sig: Inject 4.5 mg into the skin once a week.    Dispense:  6 mL    Refill:  0   DULoxetine (CYMBALTA) 60 MG capsule    Sig: Take 1 capsule (60 mg total) by mouth daily.    Dispense:  90 capsule    Refill:  0   Orders Placed This  Encounter  Procedures   Comprehensive metabolic panel   Hemoglobin A1c   TSH   VITAMIN D 25 Hydroxy (Vit-D Deficiency, Fractures)   CBC   Lipid panel   Ambulatory referral to Urology   Ambulatory referral to Endocrinology     I discussed the above assessment and treatment plan with the patient. The patient was provided an opportunity to ask questions and all were answered. The patient agreed with the plan and demonstrated an understanding of the instructions.   The patient was advised to call back or seek an in-person evaluation if the symptoms worsen or if the condition fails to improve as anticipated.   I provided a total time of 30 minutes including both face-to-face and non-face-to-face time on 04/28/2022 inclusive of time utilized for medical chart review, information gathering, care coordination with staff, and documentation  completion.    Jerrol Banana, MD   Primary Care Sports Medicine [facility]

## 2022-04-28 NOTE — Assessment & Plan Note (Signed)
See additional assessment(s) for plan details. 

## 2022-04-28 NOTE — Assessment & Plan Note (Signed)
Most recent A1c obtained during the 01/2022 timeframe showed increase, at that time increased metformin and Trulicity recommended.  Patient has been compliant with these changes, tolerating medication changes without issue.  Plan for recheck of A1c with new orders provided.  Additionally, patient interested in continuous blood glucose monitoring and further medication options.  A referral to endocrinology has been placed in that regard.

## 2022-04-28 NOTE — Assessment & Plan Note (Signed)
Recheck of levels with new labs.

## 2022-04-28 NOTE — Assessment & Plan Note (Signed)
Patient has successfully titrated duloxetine to 60 mg and has noted overall improvement in his cervical and lumbar chronic pain.  That being said he has been dosing meloxicam nightly not aware that this is an as needed medication.  Discussion on the specific role of both duloxetine and meloxicam and his musculoskeletal pain treatment was reviewed at length with questions answered.  He will transition to as needed dosing and understands that limited use of NSAIDs would be beneficial given his comorbid type 2 diabetes mellitus.  New Rx for duloxetine 60 mg sent for daily dosing.

## 2022-04-28 NOTE — Assessment & Plan Note (Signed)
Chronic issue for which patient continues to note symptomatology interfering with his quality of life.  He has tried sildenafil in the past which, while helping with achieving an erection, did not help with premature ejaculation and he had issues with timing/onset of action.  I did discuss the multifactorial nature of his concern over premature ejaculation.  He is requesting further evaluation by urology, this is reasonable given the significant interference endorsed.

## 2022-05-05 ENCOUNTER — Encounter: Payer: Self-pay | Admitting: *Deleted

## 2022-05-15 ENCOUNTER — Other Ambulatory Visit: Payer: Self-pay

## 2022-05-15 ENCOUNTER — Other Ambulatory Visit
Admission: RE | Admit: 2022-05-15 | Discharge: 2022-05-15 | Disposition: A | Discharge status: Home / Self Care | Payer: BC Managed Care – PPO | Attending: Family Medicine | Admitting: Family Medicine

## 2022-05-15 DIAGNOSIS — E119 Type 2 diabetes mellitus without complications: Secondary | ICD-10-CM | POA: Insufficient documentation

## 2022-05-15 DIAGNOSIS — I10 Essential (primary) hypertension: Secondary | ICD-10-CM

## 2022-05-15 DIAGNOSIS — E785 Hyperlipidemia, unspecified: Secondary | ICD-10-CM | POA: Insufficient documentation

## 2022-05-15 DIAGNOSIS — R7989 Other specified abnormal findings of blood chemistry: Secondary | ICD-10-CM | POA: Insufficient documentation

## 2022-05-15 DIAGNOSIS — E559 Vitamin D deficiency, unspecified: Secondary | ICD-10-CM | POA: Diagnosis not present

## 2022-05-15 LAB — COMPREHENSIVE METABOLIC PANEL
ALT: 31 U/L (ref 0–44)
ALT: 31 U/L (ref 0–44)
AST: 26 U/L (ref 15–41)
AST: 27 U/L (ref 15–41)
Albumin: 4.4 g/dL (ref 3.5–5.0)
Albumin: 4.5 g/dL (ref 3.5–5.0)
Alkaline Phosphatase: 81 U/L (ref 38–126)
Alkaline Phosphatase: 83 U/L (ref 38–126)
Anion gap: 8 (ref 5–15)
Anion gap: 9 (ref 5–15)
BUN: 11 mg/dL (ref 6–20)
BUN: 11 mg/dL (ref 6–20)
CO2: 28 mmol/L (ref 22–32)
CO2: 28 mmol/L (ref 22–32)
Calcium: 9.7 mg/dL (ref 8.9–10.3)
Calcium: 9.9 mg/dL (ref 8.9–10.3)
Chloride: 104 mmol/L (ref 98–111)
Chloride: 104 mmol/L (ref 98–111)
Creatinine, Ser: 0.88 mg/dL (ref 0.61–1.24)
Creatinine, Ser: 0.92 mg/dL (ref 0.61–1.24)
GFR, Estimated: >60 mL/min (ref >60)
GFR, Estimated: >60 mL/min (ref >60)
Glucose, Bld: 177 mg/dL — ABNORMAL HIGH (ref 70–99)
Glucose, Bld: 178 mg/dL — ABNORMAL HIGH (ref 70–99)
Potassium: 4.3 mmol/L (ref 3.5–5.1)
Potassium: 4.4 mmol/L (ref 3.5–5.1)
Sodium: 140 mmol/L (ref 135–145)
Sodium: 141 mmol/L (ref 135–145)
Total Bilirubin: 1 mg/dL (ref 0.3–1.2)
Total Bilirubin: 1 mg/dL (ref 0.3–1.2)
Total Protein: 7.6 g/dL (ref 6.5–8.1)
Total Protein: 7.7 g/dL (ref 6.5–8.1)

## 2022-05-15 LAB — CBC
HCT: 42.6 % (ref 39.0–52.0)
HCT: 42.8 % (ref 39.0–52.0)
Hemoglobin: 14.2 g/dL (ref 13.0–17.0)
Hemoglobin: 14.5 g/dL (ref 13.0–17.0)
MCH: 28.3 pg (ref 26.0–34.0)
MCH: 28.9 pg (ref 26.0–34.0)
MCHC: 33.2 g/dL (ref 30.0–36.0)
MCHC: 34 g/dL (ref 30.0–36.0)
MCV: 84.9 fL (ref 80.0–100.0)
MCV: 85.3 fL (ref 80.0–100.0)
Platelets: 160 10*3/uL (ref 150–400)
Platelets: 172 10*3/uL (ref 150–400)
RBC: 5.02 MIL/uL (ref 4.22–5.81)
RBC: 5.02 MIL/uL (ref 4.22–5.81)
RDW: 12.5 % (ref 11.5–15.5)
RDW: 12.5 % (ref 11.5–15.5)
WBC: 4.3 10*3/uL (ref 4.0–10.5)
WBC: 4.4 10*3/uL (ref 4.0–10.5)
nRBC: 0 % (ref 0.0–0.2)
nRBC: 0 % (ref 0.0–0.2)

## 2022-05-15 LAB — LIPID PANEL
Cholesterol: 130 mg/dL (ref 0–200)
Cholesterol: 130 mg/dL (ref 0–200)
HDL: 49 mg/dL (ref >40)
HDL: 51 mg/dL (ref >40)
LDL Cholesterol: 67 mg/dL (ref 0–99)
LDL Cholesterol: 69 mg/dL (ref 0–99)
Total CHOL/HDL Ratio: 2.5 RATIO
Total CHOL/HDL Ratio: 2.7 RATIO
Triglycerides: 59 mg/dL (ref <150)
Triglycerides: 62 mg/dL (ref <150)
VLDL: 12 mg/dL (ref 0–40)
VLDL: 12 mg/dL (ref 0–40)

## 2022-05-15 LAB — TSH
TSH: 0.345 u[IU]/mL — ABNORMAL LOW (ref 0.350–4.500)
TSH: 0.355 u[IU]/mL (ref 0.350–4.500)

## 2022-05-15 LAB — HEMOGLOBIN A1C
Hgb A1c MFr Bld: 8 % — ABNORMAL HIGH (ref 4.8–5.6)
Hgb A1c MFr Bld: 8 % — ABNORMAL HIGH (ref 4.8–5.6)
Mean Plasma Glucose: 182.9 mg/dL
Mean Plasma Glucose: 182.9 mg/dL

## 2022-05-15 LAB — VITAMIN D 25 HYDROXY (VIT D DEFICIENCY, FRACTURES)
Vit D, 25-Hydroxy: 11.46 ng/mL — ABNORMAL LOW (ref 30–100)
Vit D, 25-Hydroxy: 12.49 ng/mL — ABNORMAL LOW (ref 30–100)

## 2022-05-17 NOTE — Progress Notes (Signed)
Pt appt made for 05/26/2022

## 2022-05-26 ENCOUNTER — Telehealth (INDEPENDENT_AMBULATORY_CARE_PROVIDER_SITE_OTHER): Payer: BC Managed Care – PPO | Admitting: Family Medicine

## 2022-05-26 ENCOUNTER — Encounter: Payer: Self-pay | Admitting: Family Medicine

## 2022-05-26 DIAGNOSIS — R7989 Other specified abnormal findings of blood chemistry: Secondary | ICD-10-CM

## 2022-05-26 DIAGNOSIS — M47816 Spondylosis without myelopathy or radiculopathy, lumbar region: Secondary | ICD-10-CM

## 2022-05-26 DIAGNOSIS — M4302 Spondylolysis, cervical region: Secondary | ICD-10-CM

## 2022-05-26 DIAGNOSIS — R6882 Decreased libido: Secondary | ICD-10-CM

## 2022-05-26 DIAGNOSIS — E119 Type 2 diabetes mellitus without complications: Secondary | ICD-10-CM | POA: Diagnosis not present

## 2022-05-26 MED ORDER — TRULICITY 4.5 MG/0.5ML ~~LOC~~ SOAJ: 4.5000 mg | SUBCUTANEOUS | 0 refills | Status: AC

## 2022-05-26 MED ORDER — VITAMIN D (ERGOCALCIFEROL) 1.25 MG (50000 UNIT) PO CAPS: 50000.0000 [IU] | ORAL_CAPSULE | ORAL | 0 refills | Status: DC

## 2022-05-26 MED ORDER — DAPAGLIFLOZIN PROPANEDIOL 10 MG PO TABS: 10.0000 mg | ORAL_TABLET | Freq: Every day | ORAL | 0 refills | Status: DC

## 2022-05-26 MED ORDER — DULOXETINE HCL 60 MG PO CPEP: 60.0000 mg | ORAL_CAPSULE | Freq: Every day | ORAL | 0 refills | Status: DC

## 2022-05-26 NOTE — Assessment & Plan Note (Signed)
Stable elevated A1c despite increase in Trulicity from 01/2022 timeframe from 3mg  to current 4.5mg  demonstrating lack of DM2 control. He endorses compliance with this and metformin 1000 mg BID. He has not been strict from a dietary / lifestyle standpoint. He does have upcoming visit with endocrinology scheduled for 08/04/22.  Plan for addition of Farxiga to his current regimen, discussion on adherence to dietary measures, and we will follow-up in 6 months after he has been evaluated by endocrinology.

## 2022-05-26 NOTE — Assessment & Plan Note (Signed)
Has upcoming visit with Urology scheduled for 06/02/22. We will follow peripherally.

## 2022-05-26 NOTE — Progress Notes (Signed)
Primary Care / Sports Medicine Virtual Visit  Patient Information:  Patient ID: Spencer Barber, male DOB: Sep 24, 1971 Age: 51 y.o. MRN: [id number]   Spencer Barber is a pleasant 51 y.o. male presenting with the following:  Chief Complaint  Patient presents with   Follow-up    Lab follow up.    Review of Systems: No fevers, chills, night sweats, weight loss, chest pain, or shortness of breath.   Patient Active Problem List   Diagnosis Date Noted   Abnormal thyroid stimulating hormone (TSH) level 08/12/2021   Cervical spondylolysis 08/12/2021   Lumbar spondylosis 08/12/2021   Hand numbness 07/15/2021   Left knee pain 09/15/2020   HTN (hypertension) 08/23/2020   HLD (hyperlipidemia) 08/23/2020   Pneumococcal vaccine refused 08/03/2016   Reduced libido 04/03/2014   Infertile male syndrome 03/31/2014   Erectile dysfunction 03/31/2014   Routine physical examination 03/31/2014   Vaccination delay 12/31/2013   Diabetes mellitus (HCC) 12/31/2013   Past Medical History:  Diagnosis Date   Diabetes mellitus without complication (HCC)    Hyperlipidemia    Hypertension    Interstitial myositis 08/23/2020   Left varicocele 07/06/2014   Outpatient Encounter Medications as of 05/26/2022  Medication Sig   dapagliflozin propanediol (FARXIGA) 10 MG TABS tablet Take 1 tablet (10 mg total) by mouth daily before breakfast.   lisinopril (ZESTRIL) 5 MG tablet Take 1 tablet (5 mg total) by mouth daily.   meloxicam (MOBIC) 15 MG tablet Take 1 tablet (15 mg total) by mouth daily as needed for pain.   metFORMIN (GLUCOPHAGE) 1000 MG tablet Take 1 tablet (1,000 mg total) by mouth 2 (two) times daily with a meal.   rosuvastatin (CRESTOR) 10 MG tablet Take 1 tablet (10 mg total) by mouth daily.   Vitamin D, Ergocalciferol, (DRISDOL) 1.25 MG (50000 UNIT) CAPS capsule Take 1 capsule (50,000 Units total) by mouth every 7 (seven) days. Take for 8 total doses(weeks)   [DISCONTINUED] Dulaglutide  (TRULICITY) 4.5 MG/0.5ML SOPN Inject 4.5 mg into the skin once a week.   Dulaglutide (TRULICITY) 4.5 MG/0.5ML SOPN Inject 4.5 mg into the skin once a week.   DULoxetine (CYMBALTA) 60 MG capsule Take 1 capsule (60 mg total) by mouth daily.   [DISCONTINUED] DULoxetine (CYMBALTA) 60 MG capsule Take 1 capsule (60 mg total) by mouth daily. (Patient not taking: Reported on 05/26/2022)   No facility-administered encounter medications on file as of 05/26/2022.   Past Surgical History:  Procedure Laterality Date   COLONOSCOPY WITH PROPOFOL N/A 12/06/2020   Procedure: COLONOSCOPY WITH PROPOFOL;  Surgeon: Toney Reil, MD;  Location: [facility] [place] END[facility]COPY;  Service: Gastroenterology;  Laterality: N/A;   WISDOM TOOTH EXTRACTION      Virtual Visit via MyChart Video:   I connected with Spencer Barber on 05/26/22 via MyChart Video and verified that I am speaking with the correct person using appropriate identifiers.   The limitations, risks, security and privacy concerns of performing an evaluation and management service by MyChart Video, including the higher likelihood of inaccurate diagnoses and treatments, and the availability of in person appointments were reviewed. The possible need of an additional face-to-face encounter for complete and high quality delivery of care was discussed. The patient was also made aware that there may be a patient responsible charge related to this service. The patient expressed understanding and wishes to proceed.  Provider location is in medical facility. Patient location is at their home, different from provider location. People involved in care of the patient  during this telehealth encounter were myself, my nurse/medical assistant, and my front office/scheduling team member.  Objective findings:   General: Speaking full sentences, no audible heavy breathing. Sounds alert and appropriately interactive. Well-appearing. Face symmetric. Extraocular movements intact. Pupils  equal and round. No nasal flaring or accessory muscle use visualized.  Independent interpretation of notes and tests performed by another provider:   None  Pertinent History, Exam, Impression, and Recommendations:   Diabetes mellitus (HCC) Stable elevated A1c despite increase in Trulicity from 01/2022 timeframe from 3mg  to current 4.5mg  demonstrating lack of DM2 control. He endorses compliance with this and metformin 1000 mg BID. He has not been strict from a dietary / lifestyle standpoint. He does have upcoming visit with endocrinology scheduled for 08/04/22.  Plan for addition of Farxiga to his current regimen, discussion on adherence to dietary measures, and we will follow-up in 6 months after he has been evaluated by endocrinology.   Abnormal thyroid stimulating hormone (TSH) level Recent recheck in normal range, monitor per routine.  Reduced libido Has upcoming visit with Urology scheduled for 06/02/22. We will follow peripherally.  Lumbar spondylosis Had recently increased to duloxetine 60 mg, patient had been inadvertently been taking 2 capsule (120 mg), we discussed and clarified that he should dose as written on the Rx.   Cervical spondylolysis See additional assessment(s) for plan details.  Orders & Medications Meds ordered this encounter  Medications   Dulaglutide (TRULICITY) 4.5 MG/0.5ML SOPN    Sig: Inject 4.5 mg into the skin once a week.    Dispense:  6 mL    Refill:  0   dapagliflozin propanediol (FARXIGA) 10 MG TABS tablet    Sig: Take 1 tablet (10 mg total) by mouth daily before breakfast.    Dispense:  90 tablet    Refill:  0   DULoxetine (CYMBALTA) 60 MG capsule    Sig: Take 1 capsule (60 mg total) by mouth daily.    Dispense:  90 capsule    Refill:  0   Vitamin D, Ergocalciferol, (DRISDOL) 1.25 MG (50000 UNIT) CAPS capsule    Sig: Take 1 capsule (50,000 Units total) by mouth every 7 (seven) days. Take for 8 total doses(weeks)    Dispense:  8 capsule     Refill:  0   No orders of the defined types were placed in this encounter.    I discussed the above assessment and treatment plan with the patient. The patient was provided an opportunity to ask questions and all were answered. The patient agreed with the plan and demonstrated an understanding of the instructions.   The patient was advised to call back or seek an in-person evaluation if the symptoms worsen or if the condition fails to improve as anticipated.   I provided a total time of 41 minutes including both face-to-face and non-face-to-face time on 05/26/2022 inclusive of time utilized for medical chart review, information gathering, care coordination with staff, and documentation completion.    05/28/2022, MD   Primary Care Sports Medicine [facility]

## 2022-05-26 NOTE — Assessment & Plan Note (Signed)
See additional assessment(s) for plan details. 

## 2022-05-26 NOTE — Assessment & Plan Note (Signed)
Had recently increased to duloxetine 60 mg, patient had been inadvertently been taking 2 capsule (120 mg), we discussed and clarified that he should dose as written on the Rx.

## 2022-05-26 NOTE — Assessment & Plan Note (Signed)
Recent recheck in normal range, monitor per routine.

## 2022-06-02 ENCOUNTER — Ambulatory Visit: Payer: BC Managed Care – PPO | Admitting: Urology

## 2022-06-02 VITALS — BP 104/70 | HR 87 | Ht 74.0 in | Wt 215.0 lb

## 2022-06-02 DIAGNOSIS — N5201 Erectile dysfunction due to arterial insufficiency: Secondary | ICD-10-CM

## 2022-06-02 MED ORDER — TADALAFIL 20 MG PO TABS: 20.0000 mg | ORAL_TABLET | Freq: Every day | ORAL | 0 refills | Status: DC | PRN

## 2022-06-02 NOTE — Progress Notes (Signed)
06/02/2022 10:05 AM   Spencer Barber 12-26-1970 [id number]  Referring provider: Jerrol Banana, MD [address] [place],  [place] [address]  Chief Complaint  Patient presents with   Erectile Dysfunction    HPI: Spencer Barber is a 51 y.o. male referred for evaluation of erectile dysfunction.  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 If you have any questions, ask your nurse or doctor.          dapagliflozin propanediol 10 MG Tabs tablet Commonly known as: Farxiga Take 1 tablet (10 mg total) by mouth daily before breakfast.   DULoxetine 60 MG capsule Commonly known as: Cymbalta Take 1 capsule (60 mg total) by mouth daily.   lisinopril 5 MG tablet Commonly known as: ZESTRIL Take 1 tablet (5 mg total) by mouth daily.   meloxicam 15 MG tablet Commonly known as: MOBIC Take 1 tablet (15 mg total) by mouth daily as needed for  pain.   metFORMIN 1000 MG tablet Commonly known as: GLUCOPHAGE Take 1 tablet (1,000 mg total) by mouth 2 (two) times daily with a meal.   rosuvastatin 10 MG tablet Commonly known as: CRESTOR Take 1 tablet (10 mg total) by mouth daily.   Trulicity 4.5 MG/0.5ML Sopn Generic drug: Dulaglutide Inject 4.5 mg into the skin once a week.   Vitamin D (Ergocalciferol) 1.25 MG (50000 UNIT) Caps capsule Commonly known as: DRISDOL Take 1 capsule (50,000 Units total) by mouth every 7 (seven) days. Take for 8 total doses(weeks)        Allergies: No Known Allergies  Family History: Family History  Problem Relation Age of Onset   Diabetes Mother    Alcohol abuse Father    Colon cancer Maternal Great-grandmother    Thyroid cancer Neg Hx     Social History:  reports that he has never smoked. He has never used smokeless tobacco. He reports current alcohol use. He reports that he does not use drugs.   Physical Exam: BP 104/70   Pulse 87   Ht 6\' 2"  (1.88 m)   Wt 215 lb (97.5 kg)   BMI 27.60 kg/m   Constitutional:  Alert and oriented, No acute distress. HEENT: [place] AT Respiratory: Normal respiratory effort, no increased work of breathing. GU: Phallus without lesions/plaques.  Testes descended bilaterally without masses or tenderness and normal volume Neurologic: Grossly intact, no focal deficits, moving all 4 extremities. Psychiatric: Normal mood and affect.  Assessment & Plan:    1.  Erectile dysfunction Has failed PDE 5 inhibitor and intracavernosal injections Vacuum erection devices and penile implant surgery were both discussed.  He was provided literature on penile prosthesis and website information on vacuum erection devices. It has been more than 5 years since he tried a PDE 5 inhibitor and was interested in a repeat trial.  Rx sildenafil 20 mg sent to pharmacy If not effective and he desires further information on penile implant surgery he was instructed to call back for  referral to [facility] or [facility]   Riki Altes, MD  [facility] [address], [address] [place], [place] [address] [phone]

## 2022-06-03 ENCOUNTER — Encounter: Payer: Self-pay | Admitting: Urology

## 2022-07-13 ENCOUNTER — Other Ambulatory Visit: Payer: Self-pay | Admitting: Family Medicine

## 2022-07-13 DIAGNOSIS — R7989 Other specified abnormal findings of blood chemistry: Secondary | ICD-10-CM

## 2022-07-14 NOTE — Telephone Encounter (Signed)
Requested medication (s) are due for refill today: yes  Requested medication (s) are on the active medication list: yes  Last refill:  05/26/22   Future visit scheduled: no  Notes to clinic:  50,000 IU requires manual review by provider   Requested Prescriptions  Pending Prescriptions Disp Refills   Vitamin D, Ergocalciferol, (DRISDOL) 1.25 MG (50000 UNIT) CAPS capsule [Pharmacy Med Name: Vitamin D (Ergocalciferol) 1.25 MG (50000 UT) Oral Capsule] 8 capsule 0    Sig: Take 1 capsule by mouth once a week     Endocrinology:  Vitamins - Vitamin D Supplementation 2 Failed - 07/13/2022  9:02 AM      Failed - Manual Review: Route requests for 50,000 IU strength to the provider      Failed - Vitamin D in normal range and within 360 days    Vit D, 25-Hydroxy  Date Value Ref Range Status  05/15/2022 12.49 (L) 30 - 100 ng/mL Final    Comment:    (NOTE) Vitamin D deficiency has been defined by the Institute of Medicine  and an Endocrine Society practice guideline as a level of serum 25-OH  vitamin D less than 20 ng/mL (1,2). The Endocrine Society went on to  further define vitamin D insufficiency as a level between 21 and 29  ng/mL (2).  1. IOM (Institute of Medicine). 2010. Dietary reference intakes for  calcium and D. [place]: [employer]. 2. Holick MF, Binkley [place], Bischoff-Ferrari HA, et al. Evaluation,  treatment, and prevention of vitamin D deficiency: an Endocrine  Society clinical practice guideline, JCEM. 2011 Jul; 96(7): 1911-30.  Performed at [facility], [address], [place], [place] [address]   05/15/2022 11.46 (L) 30 - 100 ng/mL Final    Comment:    (NOTE) Vitamin D deficiency has been defined by the Institute of Medicine  and an Endocrine Society practice guideline as a level of serum 25-OH  vitamin D less than 20 ng/mL (1,2). The Endocrine Society went on to  further define vitamin D insufficiency as a level between 21 and 29  ng/mL  (2).  1. IOM (Institute of Medicine). 2010. Dietary reference intakes for  calcium and D. [place]: [employer]. 2. Holick MF, Binkley [place], Bischoff-Ferrari HA, et al. Evaluation,  treatment, and prevention of vitamin D deficiency: an Endocrine  Society clinical practice guideline, JCEM. 2011 Jul; 96(7): 1911-30.  Performed at [facility], [address], [place], [place] [address]          Passed - Ca in normal range and within 360 days    Calcium  Date Value Ref Range Status  05/15/2022 9.9 8.9 - 10.3 mg/dL Final  62/94/7654 9.7 8.9 - 10.3 mg/dL Final         Passed - Valid encounter within last 12 months    Recent Outpatient Visits           1 month ago Low serum vitamin D   [facility], Ocie Bob, MD   2 months ago Hypertension, unspecified type   [facility] at [facility], Ocie Bob, MD   5 months ago Hyperlipidemia, unspecified hyperlipidemia type   [facility] and [facility] Medicine at [facility], Ocie Bob, MD   8 months ago Cervical spondylolysis   [facility] Primary Care and Sports Medicine at [facility], Ocie Bob,  MD   10 months ago Abnormal thyroid stimulating hormone (TSH) level   [facility] Primary Care and Sports Medicine at [facility], Ocie Bob, MD

## 2022-08-04 DIAGNOSIS — E785 Hyperlipidemia, unspecified: Secondary | ICD-10-CM | POA: Diagnosis not present

## 2022-08-04 DIAGNOSIS — E559 Vitamin D deficiency, unspecified: Secondary | ICD-10-CM | POA: Diagnosis not present

## 2022-08-04 DIAGNOSIS — E1165 Type 2 diabetes mellitus with hyperglycemia: Secondary | ICD-10-CM | POA: Diagnosis not present

## 2022-08-04 DIAGNOSIS — N529 Male erectile dysfunction, unspecified: Secondary | ICD-10-CM | POA: Diagnosis not present

## 2022-08-04 DIAGNOSIS — Z6825 Body mass index (BMI) 25.0-25.9, adult: Secondary | ICD-10-CM | POA: Diagnosis not present

## 2022-08-04 LAB — HEMOGLOBIN A1C: Hemoglobin A1C: 7.5

## 2022-08-26 ENCOUNTER — Encounter: Payer: Self-pay | Admitting: Family Medicine

## 2022-08-26 ENCOUNTER — Other Ambulatory Visit: Payer: Self-pay | Admitting: Family Medicine

## 2022-08-26 DIAGNOSIS — E119 Type 2 diabetes mellitus without complications: Secondary | ICD-10-CM

## 2022-08-26 DIAGNOSIS — R7989 Other specified abnormal findings of blood chemistry: Secondary | ICD-10-CM

## 2022-08-28 NOTE — Telephone Encounter (Signed)
Requested medication (s) are due for refill today - provider review   Requested medication (s) are on the active medication list -yes  Future visit scheduled -no  Last refill: 05/26/22 #8  Notes to clinic: high dose Rx- provider review   Requested Prescriptions  Pending Prescriptions Disp Refills   Vitamin D, Ergocalciferol, (DRISDOL) 1.25 MG (50000 UNIT) CAPS capsule [Pharmacy Med Name: Vitamin D (Ergocalciferol) 1.25 MG (50000 UT) Oral Capsule] 8 capsule 0    Sig: Take 1 capsule by mouth once a week     Endocrinology:  Vitamins - Vitamin D Supplementation 2 Failed - 08/26/2022  9:34 PM      Failed - Manual Review: Route requests for 50,000 IU strength to the provider      Failed - Vitamin D in normal range and within 360 days    Vit D, 25-Hydroxy  Date Value Ref Range Status  05/15/2022 12.49 (L) 30 - 100 ng/mL Final    Comment:    (NOTE) Vitamin D deficiency has been defined by the Institute of Medicine  and an Endocrine Society practice guideline as a level of serum 25-OH  vitamin D less than 20 ng/mL (1,2). The Endocrine Society went on to  further define vitamin D insufficiency as a level between 21 and 29  ng/mL (2).  1. IOM (Institute of Medicine). 2010. Dietary reference intakes for  calcium and D. [place]: [employer]. 2. Holick MF, Binkley [place], Bischoff-Ferrari HA, et al. Evaluation,  treatment, and prevention of vitamin D deficiency: an Endocrine  Society clinical practice guideline, JCEM. 2011 Jul; 96(7): 1911-30.  Performed at [facility], [place] [address], [place], [place] [address]   05/15/2022 11.46 (L) 30 - 100 ng/mL Final    Comment:    (NOTE) Vitamin D deficiency has been defined by the [place] practice guideline as a level of serum 25-OH  vitamin D less than 20 ng/mL (1,2). The Endocrine Society went on to  further define vitamin D insufficiency as a level between 21 and 29  ng/mL  (2).  1. IOM (Institute of Medicine). 2010. Dietary reference intakes for  calcium and D. [place]: [employer]. 2. Holick MF, Binkley [place], Bischoff-Ferrari HA, et al. Evaluation,  treatment, and prevention of vitamin D deficiency: an Endocrine  Society clinical practice guideline, JCEM. 2011 Jul; 96(7): 1911-30.  Performed at [facility], [place] [address] [place], [place] [address]          Passed - Ca in normal range and within 360 days    Calcium  Date Value Ref Range Status  05/15/2022 9.9 8.9 - 10.3 mg/dL Final  05/15/2022 9.7 8.9 - 10.3 mg/dL Final         Passed - Valid encounter within last 12 months    Recent Outpatient Visits           3 months ago Low serum vitamin D   [place] [facility] at [place], Earley Abide, MD   4 months ago Hypertension, unspecified type   [facility] Primary Care and Sports Medicine at [place], Earley Abide, MD   6 months ago Hyperlipidemia, unspecified hyperlipidemia type   [facility] Primary [facility] and [facility] at [facility], Earley Abide, MD   10 months ago Cervical spondylolysis   [facility] at [facility], Earley Abide, MD  11 months ago Abnormal thyroid stimulating hormone (TSH) level   [place] [facility] Medicine at [place], MD              Signed Prescriptions Disp Refills   FARXIGA 10 MG TABS tablet 90 tablet 0    Sig: TAKE 1 TABLET BY MOUTH ONCE DAILY BEFORE BREAKFAST     Endocrinology:  Diabetes - SGLT2 Inhibitors Failed - 08/26/2022  9:34 PM      Failed - HBA1C is between 0 and 7.9 and within 180 days    Hgb A1c MFr Bld  Date Value Ref Range Status  05/15/2022 8.0 (H) 4.8 - 5.6 % Final    Comment:    REPEATED TO VERIFY (NOTE) Pre diabetes:          5.7%-6.4%  Diabetes:              >6.4%  Glycemic control for   <7.0% adults with  diabetes   05/15/2022 8.0 (H) 4.8 - 5.6 % Final    Comment:    REPEATED TO VERIFY (NOTE) Pre diabetes:          5.7%-6.4%  Diabetes:              >6.4%  Glycemic control for   <7.0% adults with diabetes          Passed - Cr in normal range and within 360 days    Creatinine, Ser  Date Value Ref Range Status  05/15/2022 0.92 0.61 - 1.24 mg/dL Final  05/15/2022 0.88 0.61 - 1.24 mg/dL Final   Creatinine,U  Date Value Ref Range Status  07/15/2021 128.3 mg/dL Final         Passed - eGFR in normal range and within 360 days    GFR calc Af Amer  Date Value Ref Range Status  12/03/2019 >60 >60 mL/min Final   GFR, Estimated  Date Value Ref Range Status  05/15/2022 >60 >60 mL/min Final    Comment:    (NOTE) Calculated using the CKD-EPI Creatinine Equation (2021)   05/15/2022 >60 >60 mL/min Final    Comment:    (NOTE) Calculated using the CKD-EPI Creatinine Equation (2021)    GFR  Date Value Ref Range Status  11/04/2018 80.10 >60.00 mL/min Final   eGFR  Date Value Ref Range Status  02/03/2022 104 >59 mL/min/1.73 Final         Passed - Valid encounter within last 6 months    Recent Outpatient Visits           3 months ago Low serum vitamin D   [place] [facility] at [place], Earley Abide, MD   4 months ago Hypertension, unspecified type   [place] [facility] at [place], Earley Abide, MD   6 months ago Hyperlipidemia, unspecified hyperlipidemia type   [facility] Primary Care and Sports Medicine at [place], Earley Abide, MD   10 months ago Cervical spondylolysis   [facility] Primary Care and Sports Medicine at [place], Earley Abide, MD   11 months ago Abnormal thyroid stimulating hormone (TSH) level    Primary Care and Sports Medicine at [place], Earley Abide, MD                 Requested Prescriptions  Pending Prescriptions Disp  Refills   Vitamin D, Ergocalciferol, (DRISDOL) 1.25 MG (50000 UNIT) CAPS capsule [  Pharmacy Med Name: Vitamin D (Ergocalciferol) 1.25 MG (50000 UT) Oral Capsule] 8 capsule 0    Sig: Take 1 capsule by mouth once a week     Endocrinology:  Vitamins - Vitamin D Supplementation 2 Failed - 08/26/2022  9:34 PM      Failed - Manual Review: Route requests for 50,000 IU strength to the provider      Failed - Vitamin D in normal range and within 360 days    Vit D, 25-Hydroxy  Date Value Ref Range Status  05/15/2022 12.49 (L) 30 - 100 ng/mL Final    Comment:    (NOTE) Vitamin D deficiency has been defined by the Institute of Medicine  and an Endocrine Society practice guideline as a level of serum 25-OH  vitamin D less than 20 ng/mL (1,2). The Endocrine Society went on to  further define vitamin D insufficiency as a level between 21 and 29  ng/mL (2).  1. IOM (Institute of Medicine). 2010. Dietary reference intakes for  calcium and D. [place]: [employer]. 2. Holick MF, Binkley [place], Bischoff-Ferrari HA, et al. Evaluation,  treatment, and prevention of vitamin D deficiency: an Endocrine  Society clinical practice guideline, JCEM. 2011 Jul; 96(7): 1911-30.  Performed at [facility], [place] [address] [place], [place] [address]   05/15/2022 11.46 (L) 30 - 100 ng/mL Final    Comment:    (NOTE) Vitamin D deficiency has been defined by the [place] practice guideline as a level of serum 25-OH  vitamin D less than 20 ng/mL (1,2). The Endocrine Society went on to  further define vitamin D insufficiency as a level between 21 and 29  ng/mL (2).  1. IOM (Institute of Medicine). 2010. Dietary reference intakes for  calcium and D. [place]: [employer]. 2. Holick MF, Binkley [place], Bischoff-Ferrari HA, et al. Evaluation,  treatment, and prevention of vitamin D deficiency: an Endocrine  Society clinical practice  guideline, JCEM. 2011 Jul; 96(7): 1911-30.  Performed at [facility], [place] [address] [place], [place] [address]          Passed - Ca in normal range and within 360 days    Calcium  Date Value Ref Range Status  05/15/2022 9.9 8.9 - 10.3 mg/dL Final  05/15/2022 9.7 8.9 - 10.3 mg/dL Final         Passed - Valid encounter within last 12 months    Recent Outpatient Visits           3 months ago Low serum vitamin D   [place] [facility] at [place], Earley Abide, MD   4 months ago Hypertension, unspecified type   [facility] Primary Care and Sports Medicine at [place], Earley Abide, MD   6 months ago Hyperlipidemia, unspecified hyperlipidemia type   [facility] Primary Care and Sports Medicine at [place], Earley Abide, MD   10 months ago Cervical spondylolysis   [facility] Primary Care and Sports Medicine at [facility], Earley Abide, MD   11 months ago Abnormal thyroid stimulating hormone (TSH) level   [place] Primary Care and Sports Medicine at [place], MD              Signed Prescriptions Disp Refills   FARXIGA 10 MG TABS tablet 90 tablet 0    Sig: TAKE 1 TABLET BY MOUTH  ONCE DAILY BEFORE BREAKFAST     Endocrinology:  Diabetes - SGLT2 Inhibitors Failed - 08/26/2022  9:34 PM      Failed - HBA1C is between 0 and 7.9 and within 180 days    Hgb A1c MFr Bld  Date Value Ref Range Status  05/15/2022 8.0 (H) 4.8 - 5.6 % Final    Comment:    REPEATED TO VERIFY (NOTE) Pre diabetes:          5.7%-6.4%  Diabetes:              >6.4%  Glycemic control for   <7.0% adults with diabetes   05/15/2022 8.0 (H) 4.8 - 5.6 % Final    Comment:    REPEATED TO VERIFY (NOTE) Pre diabetes:          5.7%-6.4%  Diabetes:              >6.4%  Glycemic control for   <7.0% adults with diabetes          Passed - Cr in normal range and within 360 days    Creatinine, Ser  Date  Value Ref Range Status  05/15/2022 0.92 0.61 - 1.24 mg/dL Final  05/15/2022 0.88 0.61 - 1.24 mg/dL Final   Creatinine,U  Date Value Ref Range Status  07/15/2021 128.3 mg/dL Final         Passed - eGFR in normal range and within 360 days    GFR calc Af Amer  Date Value Ref Range Status  12/03/2019 >60 >60 mL/min Final   GFR, Estimated  Date Value Ref Range Status  05/15/2022 >60 >60 mL/min Final    Comment:    (NOTE) Calculated using the CKD-EPI Creatinine Equation (2021)   05/15/2022 >60 >60 mL/min Final    Comment:    (NOTE) Calculated using the CKD-EPI Creatinine Equation (2021)    GFR  Date Value Ref Range Status  11/04/2018 80.10 >60.00 mL/min Final   eGFR  Date Value Ref Range Status  02/03/2022 104 >59 mL/min/1.73 Final         Passed - Valid encounter within last 6 months    Recent Outpatient Visits           3 months ago Low serum vitamin D   [place] [facility] at [place], Earley Abide, MD   4 months ago Hypertension, unspecified type   [facility] at [place], Earley Abide, MD   6 months ago Hyperlipidemia, unspecified hyperlipidemia type   [facility] Primary Care and Sports Medicine at [place], Earley Abide, MD   10 months ago Cervical spondylolysis   [facility] Primary Care and Sports Medicine at [place], Earley Abide, MD   11 months ago Abnormal thyroid stimulating hormone (TSH) level   [facility] Primary Care and Sports Medicine at [facility], Earley Abide, MD

## 2022-08-28 NOTE — Telephone Encounter (Signed)
Requested Prescriptions  Pending Prescriptions Disp Refills  . Vitamin D, Ergocalciferol, (DRISDOL) 1.25 MG (50000 UNIT) CAPS capsule [Pharmacy Med Name: Vitamin D (Ergocalciferol) 1.25 MG (50000 UT) Oral Capsule] 8 capsule 0    Sig: Take 1 capsule by mouth once a week     Endocrinology:  Vitamins - Vitamin D Supplementation 2 Failed - 08/26/2022  9:34 PM      Failed - Manual Review: Route requests for 50,000 IU strength to the provider      Failed - Vitamin D in normal range and within 360 days    Vit D, 25-Hydroxy  Date Value Ref Range Status  05/15/2022 12.49 (L) 30 - 100 ng/mL Final    Comment:    (NOTE) Vitamin D deficiency has been defined by the Institute of Medicine  and an Endocrine Society practice guideline as a level of serum 25-OH  vitamin D less than 20 ng/mL (1,2). The Endocrine Society went on to  further define vitamin D insufficiency as a level between 21 and 29  ng/mL (2).  1. IOM (Institute of Medicine). 2010. Dietary reference intakes for  calcium and D. [place]: [employer]. 2. Holick MF, Binkley [place], Bischoff-Ferrari HA, et al. Evaluation,  treatment, and prevention of vitamin D deficiency: an Endocrine  Society clinical practice guideline, JCEM. 2011 Jul; 96(7): 1911-30.  Performed at [facility], [place] [address] [place], [place] [address]   05/15/2022 11.46 (L) 30 - 100 ng/mL Final    Comment:    (NOTE) Vitamin D deficiency has been defined by the [place] practice guideline as a level of serum 25-OH  vitamin D less than 20 ng/mL (1,2). The Endocrine Society went on to  further define vitamin D insufficiency as a level between 21 and 29  ng/mL (2).  1. IOM (Institute of Medicine). 2010. Dietary reference intakes for  calcium and D. [place]: [employer]. 2. Holick MF, Binkley [place], Bischoff-Ferrari HA, et al. Evaluation,  treatment, and prevention of vitamin D  deficiency: an Endocrine  Society clinical practice guideline, JCEM. 2011 Jul; 96(7): 1911-30.  Performed at [facility], [place] [address], [place], [place] [address]          Passed - Ca in normal range and within 360 days    Calcium  Date Value Ref Range Status  05/15/2022 9.9 8.9 - 10.3 mg/dL Final  05/15/2022 9.7 8.9 - 10.3 mg/dL Final         Passed - Valid encounter within last 12 months    Recent Outpatient Visits          3 months ago Low serum vitamin D   [place] [facility] at [place], Earley Abide, MD   4 months ago Hypertension, unspecified type   [facility] Primary [facility] and [facility] Medicine at [place], Earley Abide, MD   6 months ago Hyperlipidemia, unspecified hyperlipidemia type   [facility] Primary Care and [facility] Medicine at [facility], Earley Abide, MD   10 months ago Cervical spondylolysis   [facility] Primary Care and Sports Medicine at [facility], Earley Abide, MD   11 months ago Abnormal thyroid stimulating hormone (TSH) level   [facility] Primary Care and Sports Medicine at [place], MD             . FARXIGA 10 MG TABS tablet [  Pharmacy Med Name: Farxiga 10 MG Oral Tablet] 90 tablet 0    Sig: TAKE 1 TABLET BY MOUTH ONCE DAILY BEFORE BREAKFAST     Endocrinology:  Diabetes - SGLT2 Inhibitors Failed - 08/26/2022  9:34 PM      Failed - HBA1C is between 0 and 7.9 and within 180 days    Hgb A1c MFr Bld  Date Value Ref Range Status  05/15/2022 8.0 (H) 4.8 - 5.6 % Final    Comment:    REPEATED TO VERIFY (NOTE) Pre diabetes:          5.7%-6.4%  Diabetes:              >6.4%  Glycemic control for   <7.0% adults with diabetes   05/15/2022 8.0 (H) 4.8 - 5.6 % Final    Comment:    REPEATED TO VERIFY (NOTE) Pre diabetes:          5.7%-6.4%  Diabetes:              >6.4%  Glycemic control for   <7.0% adults with diabetes          Passed - Cr  in normal range and within 360 days    Creatinine, Ser  Date Value Ref Range Status  05/15/2022 0.92 0.61 - 1.24 mg/dL Final  05/15/2022 0.88 0.61 - 1.24 mg/dL Final   Creatinine,U  Date Value Ref Range Status  07/15/2021 128.3 mg/dL Final         Passed - eGFR in normal range and within 360 days    GFR calc Af Amer  Date Value Ref Range Status  12/03/2019 >60 >60 mL/min Final   GFR, Estimated  Date Value Ref Range Status  05/15/2022 >60 >60 mL/min Final    Comment:    (NOTE) Calculated using the CKD-EPI Creatinine Equation (2021)   05/15/2022 >60 >60 mL/min Final    Comment:    (NOTE) Calculated using the CKD-EPI Creatinine Equation (2021)    GFR  Date Value Ref Range Status  11/04/2018 80.10 >60.00 mL/min Final   eGFR  Date Value Ref Range Status  02/03/2022 104 >59 mL/min/1.73 Final         Passed - Valid encounter within last 6 months    Recent Outpatient Visits          3 months ago Low serum vitamin D   [place] [facility] Medicine at [place], Earley Abide, MD   4 months ago Hypertension, unspecified type   [place] [facility] and [facility] Medicine at [place], Earley Abide, MD   6 months ago Hyperlipidemia, unspecified hyperlipidemia type   [facility] Primary Care and Sports Medicine at [place], Earley Abide, MD   10 months ago Cervical spondylolysis   [facility] Primary Care and Sports Medicine at [place], Earley Abide, MD   11 months ago Abnormal thyroid stimulating hormone (TSH) level   [facility] and [facility] at [facility], Earley Abide, MD

## 2022-08-28 NOTE — Telephone Encounter (Signed)
Please advise 

## 2022-09-06 ENCOUNTER — Encounter: Payer: Self-pay | Admitting: Family Medicine

## 2022-10-07 ENCOUNTER — Other Ambulatory Visit: Payer: Self-pay | Admitting: Family Medicine

## 2022-10-07 ENCOUNTER — Other Ambulatory Visit: Payer: Self-pay | Admitting: Family

## 2022-10-07 DIAGNOSIS — R7989 Other specified abnormal findings of blood chemistry: Secondary | ICD-10-CM

## 2022-10-07 DIAGNOSIS — E119 Type 2 diabetes mellitus without complications: Secondary | ICD-10-CM

## 2022-10-10 NOTE — Telephone Encounter (Signed)
Requested medication (s) are due for refill today: yes  Requested medication (s) are on the active medication list: yes  Last refill:  05/26/22 #8 0 refills  Future visit scheduled: no  Notes to clinic:  protocol failed. No refills remain. Do you want to refill Rx?     Requested Prescriptions  Pending Prescriptions Disp Refills   Vitamin D, Ergocalciferol, (DRISDOL) 1.25 MG (50000 UNIT) CAPS capsule [Pharmacy Med Name: Vitamin D (Ergocalciferol) 1.25 MG (50000 UT) Oral Capsule] 8 capsule 0    Sig: Take 1 capsule by mouth once a week     Endocrinology:  Vitamins - Vitamin D Supplementation 2 Failed - 10/07/2022 10:19 PM      Failed - Manual Review: Route requests for 50,000 IU strength to the provider      Failed - Vitamin D in normal range and within 360 days    Vit D, 25-Hydroxy  Date Value Ref Range Status  05/15/2022 12.49 (L) 30 - 100 ng/mL Final    Comment:    (NOTE) Vitamin D deficiency has been defined by the Institute of Medicine  and an Endocrine Society practice guideline as a level of serum 25-OH  vitamin D less than 20 ng/mL (1,2). The Endocrine Society went on to  further define vitamin D insufficiency as a level between 21 and 29  ng/mL (2).  1. IOM (Institute of Medicine). 2010. Dietary reference intakes for  calcium and D. [place]: [employer]. 2. Holick MF, Binkley [place], Bischoff-Ferrari HA, et al. Evaluation,  treatment, and prevention of vitamin D deficiency: an Endocrine  Society clinical practice guideline, JCEM. 2011 Jul; 96(7): 1911-30.  Performed at [facility], [address], [place], [place] [address]   05/15/2022 11.46 (L) 30 - 100 ng/mL Final    Comment:    (NOTE) Vitamin D deficiency has been defined by the Institute of Medicine  and an Endocrine Society practice guideline as a level of serum 25-OH  vitamin D less than 20 ng/mL (1,2). The Endocrine Society went on to  further define vitamin D insufficiency as a  level between 21 and 29  ng/mL (2).  1. IOM (Institute of Medicine). 2010. Dietary reference intakes for  calcium and D. [place]: [employer]. 2. Holick MF, Binkley [place], Bischoff-Ferrari HA, et al. Evaluation,  treatment, and prevention of vitamin D deficiency: an Endocrine  Society clinical practice guideline, JCEM. 2011 Jul; 96(7): 1911-30.  Performed at [facility], [address], [place], [place] [address]          Passed - Ca in normal range and within 360 days    Calcium  Date Value Ref Range Status  05/15/2022 9.9 8.9 - 10.3 mg/dL Final  63/87/5643 9.7 8.9 - 10.3 mg/dL Final         Passed - Valid encounter within last 12 months    Recent Outpatient Visits           4 months ago Low serum vitamin D    [facility] at [facility], Ocie Bob, MD   5 months ago Hypertension, unspecified type   [facility] at [facility], Ocie Bob, MD   8 months ago Hyperlipidemia, unspecified hyperlipidemia type   [facility] at [facility], Ocie Bob, MD   11 months ago Cervical spondylolysis   [facility] Primary Care and Sports  Medicine at [facility], Ocie Bob, MD   1 year ago Abnormal thyroid stimulating hormone (TSH) level   [facility] Primary [facility] and [facility] Medicine at [facility], Ocie Bob, MD

## 2022-10-25 ENCOUNTER — Other Ambulatory Visit: Payer: Self-pay | Admitting: Family Medicine

## 2022-10-25 DIAGNOSIS — E119 Type 2 diabetes mellitus without complications: Secondary | ICD-10-CM

## 2022-10-25 MED ORDER — METFORMIN HCL 1000 MG PO TABS: 1000.0000 mg | ORAL_TABLET | Freq: Two times a day (BID) | ORAL | 0 refills | Status: DC

## 2022-10-25 NOTE — Telephone Encounter (Signed)
Medication Refill - Medication: metFORMIN (GLUCOPHAGE) 1000 MG tablet ,   Has the patient contacted their pharmacy? Yes.     Preferred Pharmacy (with phone number or street name):  [facility] [place], [place] - [address] Phone: [phone]  Fax: [phone]     Has the patient been seen for an appointment in the last year OR does the patient have an upcoming appointment? Yes.    The patient is completely out of both of this medication and needs a refill as soon as possible. Please assist patient further

## 2022-10-25 NOTE — Telephone Encounter (Signed)
Requested Prescriptions  Pending Prescriptions Disp Refills   metFORMIN (GLUCOPHAGE) 1000 MG tablet 180 tablet 0    Sig: Take 1 tablet (1,000 mg total) by mouth 2 (two) times daily with a meal.     Endocrinology:  Diabetes - Biguanides Failed - 10/25/2022  8:54 AM      Failed - CBC within normal limits and completed in the last 12 months    WBC  Date Value Ref Range Status  05/15/2022 4.4 4.0 - 10.5 K/uL Final  05/15/2022 4.3 4.0 - 10.5 K/uL Final   RBC  Date Value Ref Range Status  05/15/2022 5.02 4.22 - 5.81 MIL/uL Final  05/15/2022 5.02 4.22 - 5.81 MIL/uL Final   Hemoglobin  Date Value Ref Range Status  05/15/2022 14.5 13.0 - 17.0 g/dL Final  05/15/2022 14.2 13.0 - 17.0 g/dL Final   HCT  Date Value Ref Range Status  05/15/2022 42.6 39.0 - 52.0 % Final  05/15/2022 42.8 39.0 - 52.0 % Final   MCHC  Date Value Ref Range Status  05/15/2022 34.0 30.0 - 36.0 g/dL Final  05/15/2022 33.2 30.0 - 36.0 g/dL Final   [facility]  Date Value Ref Range Status  05/15/2022 28.9 26.0 - 34.0 pg Final  05/15/2022 28.3 26.0 - 34.0 pg Final   MCV  Date Value Ref Range Status  05/15/2022 84.9 80.0 - 100.0 fL Final  05/15/2022 85.3 80.0 - 100.0 fL Final   No results found for: "PLTCOUNTKUC", "LABPLAT", "POCPLA" RDW  Date Value Ref Range Status  05/15/2022 12.5 11.5 - 15.5 % Final  05/15/2022 12.5 11.5 - 15.5 % Final         Passed - Cr in normal range and within 360 days    Creatinine, Ser  Date Value Ref Range Status  05/15/2022 0.92 0.61 - 1.24 mg/dL Final  05/15/2022 0.88 0.61 - 1.24 mg/dL Final   Creatinine,U  Date Value Ref Range Status  07/15/2021 128.3 mg/dL Final         Passed - HBA1C is between 0 and 7.9 and within 180 days    Hemoglobin A1C  Date Value Ref Range Status  08/04/2022 7.5  Final         Passed - eGFR in normal range and within 360 days    GFR calc Af Amer  Date Value Ref Range Status  12/03/2019 >60 >60 mL/min Final   GFR, Estimated  Date Value Ref  Range Status  05/15/2022 >60 >60 mL/min Final    Comment:    (NOTE) Calculated using the CKD-EPI Creatinine Equation (2021)   05/15/2022 >60 >60 mL/min Final    Comment:    (NOTE) Calculated using the CKD-EPI Creatinine Equation (2021)    GFR  Date Value Ref Range Status  11/04/2018 80.10 >60.00 mL/min Final   eGFR  Date Value Ref Range Status  02/03/2022 104 >59 mL/min/1.73 Final         Passed - B12 Level in normal range and within 720 days    Vitamin B-12  Date Value Ref Range Status  07/15/2021 295 211 - 911 pg/mL Final         Passed - Valid encounter within last 6 months    Recent Outpatient Visits           5 months ago Low serum vitamin D   [place] [facility] at [place], Earley Abide, MD   6 months ago Hypertension, unspecified type   [facility]  Care and Sports Medicine at [facility], Earley Abide, MD   8 months ago Hyperlipidemia, unspecified hyperlipidemia type   [facility] at [facility], Earley Abide, MD   12 months ago Cervical spondylolysis   [facility] Medicine at [facility], Earley Abide, MD   1 year ago Abnormal thyroid stimulating hormone (TSH) level   [facility] and [facility] Medicine at [facility], Earley Abide, MD

## 2022-10-29 ENCOUNTER — Other Ambulatory Visit: Payer: Self-pay | Admitting: Family Medicine

## 2022-10-29 DIAGNOSIS — E119 Type 2 diabetes mellitus without complications: Secondary | ICD-10-CM

## 2022-10-30 NOTE — Telephone Encounter (Signed)
Sees endo, has appointment on 12/06/22. Refill?

## 2022-10-30 NOTE — Telephone Encounter (Signed)
Requested medication (s) are due for refill today: yes  Requested medication (s) are on the active medication list: no  Last refill:  05/26/22 4.5 mg  Future visit scheduled: no  Notes to clinic:  expired med order- date 08/24/22-    Requested Prescriptions  Pending Prescriptions Disp Refills   TRULICITY 4.5 MG/0.5ML SOPN [Pharmacy Med Name: Trulicity 4.5 MG/0.5ML Subcutaneous Solution Pen-injector] 12 mL 0    Sig: INJECT ONE SYRINGEFUL INTO THE SKIN ONCE WEEKLY     Endocrinology:  Diabetes - GLP-1 Receptor Agonists Passed - 10/29/2022  8:25 AM      Passed - HBA1C is between 0 and 7.9 and within 180 days    Hemoglobin A1C  Date Value Ref Range Status  08/04/2022 7.5  Final         Passed - Valid encounter within last 6 months    Recent Outpatient Visits           5 months ago Low serum vitamin D   [place] [facility] at [facility], Ocie Bob, MD   6 months ago Hypertension, unspecified type   [place] [facility] at [facility], Ocie Bob, MD   8 months ago Hyperlipidemia, unspecified hyperlipidemia type   [facility] and [facility] Medicine at [facility], Ocie Bob, MD   1 year ago Cervical spondylolysis   [facility] at [facility], Ocie Bob, MD   1 year ago Abnormal thyroid stimulating hormone (TSH) level   [facility] and [facility] Medicine at [facility], Ocie Bob, MD

## 2022-11-17 ENCOUNTER — Encounter: Payer: Self-pay | Admitting: Family Medicine

## 2022-11-17 DIAGNOSIS — E119 Type 2 diabetes mellitus without complications: Secondary | ICD-10-CM | POA: Diagnosis not present

## 2022-11-20 NOTE — Telephone Encounter (Signed)
Please advise 

## 2022-12-01 DIAGNOSIS — E1165 Type 2 diabetes mellitus with hyperglycemia: Secondary | ICD-10-CM | POA: Diagnosis not present

## 2022-12-01 DIAGNOSIS — E785 Hyperlipidemia, unspecified: Secondary | ICD-10-CM | POA: Diagnosis not present

## 2022-12-09 ENCOUNTER — Other Ambulatory Visit: Payer: Self-pay | Admitting: Family Medicine

## 2022-12-09 DIAGNOSIS — E785 Hyperlipidemia, unspecified: Secondary | ICD-10-CM

## 2022-12-11 NOTE — Telephone Encounter (Signed)
Requested Prescriptions  Pending Prescriptions Disp Refills   rosuvastatin (CRESTOR) 10 MG tablet [Pharmacy Med Name: Rosuvastatin Calcium 10 MG Oral Tablet] 90 tablet 0    Sig: Take 1 tablet by mouth once daily     Cardiovascular:  Antilipid - Statins 2 Failed - 12/09/2022  9:46 AM      Failed - Lipid Panel in normal range within the last 12 months    Cholesterol  Date Value Ref Range Status  05/15/2022 130 0 - 200 mg/dL Final  05/15/2022 130 0 - 200 mg/dL Final   LDL Cholesterol  Date Value Ref Range Status  05/15/2022 67 0 - 99 mg/dL Final    Comment:           Total Cholesterol/HDL:CHD Risk Coronary Heart Disease Risk Table                     Men   Women  1/2 Average Risk   3.4   3.3  Average Risk       5.0   4.4  2 X Average Risk   9.6   7.1  3 X Average Risk  23.4   11.0        Use the calculated Patient Ratio above and the CHD Risk Table to determine the patient's CHD Risk.        ATP III CLASSIFICATION (LDL):  <100     mg/dL   Optimal  100-129  mg/dL   Near or Above                    Optimal  130-159  mg/dL   Borderline  160-189  mg/dL   High  >190     mg/dL   Very High Performed at [facility], [address] [place], [place] [address]   05/15/2022 69 0 - 99 mg/dL Final    Comment:           Total Cholesterol/HDL:CHD Risk Coronary Heart Disease Risk Table                     Men   Women  1/2 Average Risk   3.4   3.3  Average Risk       5.0   4.4  2 X Average Risk   9.6   7.1  3 X Average Risk  23.4   11.0        Use the calculated Patient Ratio above and the CHD Risk Table to determine the patient's CHD Risk.        ATP III CLASSIFICATION (LDL):  <100     mg/dL   Optimal  100-129  mg/dL   Near or Above                    Optimal  130-159  mg/dL   Borderline  160-189  mg/dL   High  >190     mg/dL   Very High Performed at [facility], [place], [place] [address]    HDL  Date Value Ref Range Status   05/15/2022 51 >40 mg/dL Final  05/15/2022 49 >40 mg/dL Final   Triglycerides  Date Value Ref Range Status  05/15/2022 59 <150 mg/dL Final  05/15/2022 62 <150 mg/dL Final         Passed - Cr in normal range and within 360 days    Creatinine, Ser  Date Value Ref Range Status  05/15/2022  0.92 0.61 - 1.24 mg/dL Final  05/15/2022 0.88 0.61 - 1.24 mg/dL Final   Creatinine,U  Date Value Ref Range Status  07/15/2021 128.3 mg/dL Final         Passed - Patient is not pregnant      Passed - Valid encounter within last 12 months    Recent Outpatient Visits           6 months ago Low serum vitamin D   [place] [facility] [place], Earley Abide, MD   7 months ago Hypertension, unspecified type   [facility] Primary Care & Sports Medicine at [place], Earley Abide, MD   10 months ago Hyperlipidemia, unspecified hyperlipidemia type   [facility] Primary Care & Sports Medicine at [place], Earley Abide, MD   1 year ago Cervical spondylolysis   [facility] Primary Care & Sports Medicine at [place], Earley Abide, MD   1 year ago Abnormal thyroid stimulating hormone (TSH) level   [facility] Primary Care & Sports Medicine at [facility], Earley Abide, MD

## 2022-12-29 DIAGNOSIS — E1165 Type 2 diabetes mellitus with hyperglycemia: Secondary | ICD-10-CM | POA: Diagnosis not present

## 2023-01-17 ENCOUNTER — Other Ambulatory Visit: Payer: Self-pay | Admitting: Family Medicine

## 2023-01-17 DIAGNOSIS — E119 Type 2 diabetes mellitus without complications: Secondary | ICD-10-CM

## 2023-01-17 NOTE — Telephone Encounter (Signed)
Patient needs OV, will refill medication for 30 days until OV can be made.  OV needed for additional refills. Requested Prescriptions  Pending Prescriptions Disp Refills   metFORMIN (GLUCOPHAGE) 1000 MG tablet [Pharmacy Med Name: metFORMIN HCl 1000 MG Oral Tablet] 60 tablet 0    Sig: TAKE 1 TABLET BY MOUTH TWICE DAILY WITH MEALS     Endocrinology:  Diabetes - Biguanides Failed - 01/17/2023  9:43 AM      Failed - Valid encounter within last 6 months    Recent Outpatient Visits           7 months ago Low serum vitamin D   [place] [facility] at [place], Earley Abide, MD   8 months ago Hypertension, unspecified type   [facility] Care & Sports Medicine at [facility], Earley Abide, MD   11 months ago Hyperlipidemia, unspecified hyperlipidemia type   [place] [facility] at [place], Earley Abide, MD   1 year ago Cervical spondylolysis   [place] [facility] [place], Earley Abide, MD   1 year ago Abnormal thyroid stimulating hormone (TSH) level   [place] [facility] at [facility], Earley Abide, MD              Failed - CBC within normal limits and completed in the last 12 months    WBC  Date Value Ref Range Status  05/15/2022 4.4 4.0 - 10.5 K/uL Final  05/15/2022 4.3 4.0 - 10.5 K/uL Final   RBC  Date Value Ref Range Status  05/15/2022 5.02 4.22 - 5.81 MIL/uL Final  05/15/2022 5.02 4.22 - 5.81 MIL/uL Final   Hemoglobin  Date Value Ref Range Status  05/15/2022 14.5 13.0 - 17.0 g/dL Final  05/15/2022 14.2 13.0 - 17.0 g/dL Final   HCT  Date Value Ref Range Status  05/15/2022 42.6 39.0 - 52.0 % Final  05/15/2022 42.8 39.0 - 52.0 % Final   MCHC  Date Value Ref Range Status  05/15/2022 34.0 30.0 - 36.0 g/dL Final  05/15/2022 33.2 30.0 - 36.0 g/dL Final   [facility]  Date Value Ref Range Status  05/15/2022 28.9 26.0 - 34.0 pg  Final  05/15/2022 28.3 26.0 - 34.0 pg Final   MCV  Date Value Ref Range Status  05/15/2022 84.9 80.0 - 100.0 fL Final  05/15/2022 85.3 80.0 - 100.0 fL Final   No results found for: "PLTCOUNTKUC", "LABPLAT", "POCPLA" RDW  Date Value Ref Range Status  05/15/2022 12.5 11.5 - 15.5 % Final  05/15/2022 12.5 11.5 - 15.5 % Final         Passed - Cr in normal range and within 360 days    Creatinine, Ser  Date Value Ref Range Status  05/15/2022 0.92 0.61 - 1.24 mg/dL Final  05/15/2022 0.88 0.61 - 1.24 mg/dL Final   Creatinine,U  Date Value Ref Range Status  07/15/2021 128.3 mg/dL Final         Passed - HBA1C is between 0 and 7.9 and within 180 days    Hemoglobin A1C  Date Value Ref Range Status  08/04/2022 7.5  Final         Passed - eGFR in normal range and within 360 days    GFR calc Af Amer  Date Value Ref Range Status  12/03/2019 >60 >60 mL/min Final   GFR, Estimated  Date Value Ref  Range Status  05/15/2022 >60 >60 mL/min Final    Comment:    (NOTE) Calculated using the CKD-EPI Creatinine Equation (2021)   05/15/2022 >60 >60 mL/min Final    Comment:    (NOTE) Calculated using the CKD-EPI Creatinine Equation (2021)    GFR  Date Value Ref Range Status  11/04/2018 80.10 >60.00 mL/min Final   eGFR  Date Value Ref Range Status  02/03/2022 104 >59 mL/min/1.73 Final         Passed - B12 Level in normal range and within 720 days    Vitamin B-12  Date Value Ref Range Status  07/15/2021 295 211 - 911 pg/mL Final

## 2023-02-16 ENCOUNTER — Other Ambulatory Visit: Payer: Self-pay | Admitting: Family Medicine

## 2023-02-16 DIAGNOSIS — E119 Type 2 diabetes mellitus without complications: Secondary | ICD-10-CM

## 2023-02-16 NOTE — Telephone Encounter (Signed)
Called pt - left message on machine to call back for OV.

## 2023-02-16 NOTE — Telephone Encounter (Signed)
Requested medications are due for refill today.  yes  Requested medications are on the active medications list.  yes  Last refill. 01/17/2023 #60 0 rf  Future visit scheduled.   no  Notes to clinic.  Pt is over due for OV. Courtesy refill already given.    Requested Prescriptions  Pending Prescriptions Disp Refills   metFORMIN (GLUCOPHAGE) 1000 MG tablet [Pharmacy Med Name: metFORMIN HCl 1000 MG Oral Tablet] 60 tablet 0    Sig: TAKE 1 TABLET BY MOUTH TWICE DAILY WITH MEALS     Endocrinology:  Diabetes - Biguanides Failed - 02/16/2023  3:18 PM      Failed - HBA1C is between 0 and 7.9 and within 180 days    Hemoglobin A1C  Date Value Ref Range Status  08/04/2022 7.5  Final         Failed - Valid encounter within last 6 months    Recent Outpatient Visits           8 months ago Low serum vitamin D   [place] [facility], Ocie BobJason J, MD   9 months ago Hypertension, unspecified type   [place] [facility], Ocie BobJason J, MD   1 year ago Hyperlipidemia, unspecified hyperlipidemia type   [place] [facility], Ocie BobJason J, MD   1 year ago Cervical spondylolysis   [place] [facility], Ocie BobJason J, MD   1 year ago Abnormal thyroid stimulating hormone (TSH) level    [facility] Ocie BobJason J, MD              Failed - CBC within normal limits and completed in the last 12 months    WBC  Date Value Ref Range Status  05/15/2022 4.4 4.0 - 10.5 K/uL Final  05/15/2022 4.3 4.0 - 10.5 K/uL Final   RBC  Date Value Ref Range Status  05/15/2022 5.02 4.22 - 5.81 MIL/uL Final  05/15/2022 5.02 4.22 - 5.81 MIL/uL Final   Hemoglobin  Date Value Ref Range Status  05/15/2022 14.5 13.0 - 17.0 g/dL Final  16/10/960407/01/2022 54.014.2 13.0 - 17.0 g/dL Final   HCT   Date Value Ref Range Status  05/15/2022 42.6 39.0 - 52.0 % Final  05/15/2022 42.8 39.0 - 52.0 % Final   MCHC  Date Value Ref Range Status  05/15/2022 34.0 30.0 - 36.0 g/dL Final  98/11/914707/01/2022 82.933.2 30.0 - 36.0 g/dL Final   [facility]  Date Value Ref Range Status  05/15/2022 28.9 26.0 - 34.0 pg Final  05/15/2022 28.3 26.0 - 34.0 pg Final   MCV  Date Value Ref Range Status  05/15/2022 84.9 80.0 - 100.0 fL Final  05/15/2022 85.3 80.0 - 100.0 fL Final   No results found for: "PLTCOUNTKUC", "LABPLAT", "POCPLA" RDW  Date Value Ref Range Status  05/15/2022 12.5 11.5 - 15.5 % Final  05/15/2022 12.5 11.5 - 15.5 % Final         Passed - Cr in normal range and within 360 days    Creatinine, Ser  Date Value Ref Range Status  05/15/2022 0.92 0.61 - 1.24 mg/dL Final  56/21/308607/01/2022 5.780.88 0.61 - 1.24 mg/dL Final   Creatinine,U  Date Value Ref Range Status  07/15/2021 128.3 mg/dL Final         Passed - eGFR in normal  range and within 360 days    GFR calc Af Amer  Date Value Ref Range Status  12/03/2019 >60 >60 mL/min Final   GFR, Estimated  Date Value Ref Range Status  05/15/2022 >60 >60 mL/min Final    Comment:    (NOTE) Calculated using the CKD-EPI Creatinine Equation (2021)   05/15/2022 >60 >60 mL/min Final    Comment:    (NOTE) Calculated using the CKD-EPI Creatinine Equation (2021)    GFR  Date Value Ref Range Status  11/04/2018 80.10 >60.00 mL/min Final   eGFR  Date Value Ref Range Status  02/03/2022 104 >59 mL/min/1.73 Final         Passed - B12 Level in normal range and within 720 days    Vitamin B-12  Date Value Ref Range Status  07/15/2021 295 211 - 911 pg/mL Final

## 2023-02-16 NOTE — Telephone Encounter (Signed)
Protocol failed. Patient needs appt scheduled for further refills.

## 2023-02-25 ENCOUNTER — Other Ambulatory Visit: Payer: Self-pay | Admitting: Family Medicine

## 2023-02-25 DIAGNOSIS — E785 Hyperlipidemia, unspecified: Secondary | ICD-10-CM

## 2023-02-25 DIAGNOSIS — E119 Type 2 diabetes mellitus without complications: Secondary | ICD-10-CM

## 2023-02-26 ENCOUNTER — Encounter: Payer: Self-pay | Admitting: Family Medicine

## 2023-02-26 ENCOUNTER — Other Ambulatory Visit: Payer: Self-pay | Admitting: Family Medicine

## 2023-02-26 ENCOUNTER — Other Ambulatory Visit: Payer: Self-pay

## 2023-02-26 DIAGNOSIS — E785 Hyperlipidemia, unspecified: Secondary | ICD-10-CM

## 2023-02-26 DIAGNOSIS — E119 Type 2 diabetes mellitus without complications: Secondary | ICD-10-CM

## 2023-02-26 MED ORDER — LISINOPRIL 5 MG PO TABS: 5.0000 mg | ORAL_TABLET | Freq: Every day | ORAL | 0 refills | Status: DC

## 2023-02-27 NOTE — Telephone Encounter (Signed)
Requested Prescriptions  Pending Prescriptions Disp Refills   metFORMIN (GLUCOPHAGE) 1000 MG tablet [Pharmacy Med Name: metFORMIN HCl 1000 MG Oral Tablet] 180 tablet 0    Sig: TAKE 1 TABLET BY MOUTH TWICE DAILY WITH MEALS     Endocrinology:  Diabetes - Biguanides Failed - 02/25/2023  2:11 PM      Failed - HBA1C is between 0 and 7.9 and within 180 days    Hemoglobin A1C  Date Value Ref Range Status  08/04/2022 7.5  Final         Failed - Valid encounter within last 6 months    Recent Outpatient Visits           9 months ago Low serum vitamin D   [place] [facility] at [facility], Ocie Bob, MD   10 months ago Hypertension, unspecified type   [place] [facility] at [facility], Ocie Bob, MD   1 year ago Hyperlipidemia, unspecified hyperlipidemia type   [place] [facility] at [facility], Ocie Bob, MD   1 year ago Cervical spondylolysis   [place] [facility] at [facility], Ocie Bob, MD   1 year ago Abnormal thyroid stimulating hormone (TSH) level   [place] [facility] at [facility], Ocie Bob, MD              Failed - CBC within normal limits and completed in the last 12 months    WBC  Date Value Ref Range Status  05/15/2022 4.4 4.0 - 10.5 K/uL Final  05/15/2022 4.3 4.0 - 10.5 K/uL Final   RBC  Date Value Ref Range Status  05/15/2022 5.02 4.22 - 5.81 MIL/uL Final  05/15/2022 5.02 4.22 - 5.81 MIL/uL Final   Hemoglobin  Date Value Ref Range Status  05/15/2022 14.5 13.0 - 17.0 g/dL Final  40/98/1191 47.8 13.0 - 17.0 g/dL Final   HCT  Date Value Ref Range Status  05/15/2022 42.6 39.0 - 52.0 % Final  05/15/2022 42.8 39.0 - 52.0 % Final   MCHC  Date Value Ref Range Status  05/15/2022 34.0 30.0 - 36.0 g/dL Final  29/56/2130 86.5 30.0 - 36.0 g/dL Final   [facility]  Date Value Ref Range Status   05/15/2022 28.9 26.0 - 34.0 pg Final  05/15/2022 28.3 26.0 - 34.0 pg Final   MCV  Date Value Ref Range Status  05/15/2022 84.9 80.0 - 100.0 fL Final  05/15/2022 85.3 80.0 - 100.0 fL Final   No results found for: "PLTCOUNTKUC", "LABPLAT", "POCPLA" RDW  Date Value Ref Range Status  05/15/2022 12.5 11.5 - 15.5 % Final  05/15/2022 12.5 11.5 - 15.5 % Final         Passed - Cr in normal range and within 360 days    Creatinine, Ser  Date Value Ref Range Status  05/15/2022 0.92 0.61 - 1.24 mg/dL Final  78/46/9629 5.28 0.61 - 1.24 mg/dL Final   Creatinine,U  Date Value Ref Range Status  07/15/2021 128.3 mg/dL Final         Passed - eGFR in normal range and within 360 days    GFR calc Af Amer  Date Value Ref Range Status  12/03/2019 >60 >60 mL/min Final   GFR, Estimated  Date Value Ref Range Status  05/15/2022 >60 >60 mL/min Final    Comment:    (NOTE) Calculated using the CKD-EPI  Creatinine Equation (2021)   05/15/2022 >60 >60 mL/min Final    Comment:    (NOTE) Calculated using the CKD-EPI Creatinine Equation (2021)    GFR  Date Value Ref Range Status  11/04/2018 80.10 >60.00 mL/min Final   eGFR  Date Value Ref Range Status  02/03/2022 104 >59 mL/min/1.73 Final         Passed - B12 Level in normal range and within 720 days    Vitamin B-12  Date Value Ref Range Status  07/15/2021 295 211 - 911 pg/mL Final         Refused Prescriptions Disp Refills   lisinopril (ZESTRIL) 5 MG tablet [Pharmacy Med Name: Lisinopril 5 MG Oral Tablet] 90 tablet 0    Sig: Take 1 tablet by mouth once daily     Cardiovascular:  ACE Inhibitors Failed - 02/25/2023  2:11 PM      Failed - Cr in normal range and within 180 days    Creatinine, Ser  Date Value Ref Range Status  05/15/2022 0.92 0.61 - 1.24 mg/dL Final  16/08/9603 5.40 0.61 - 1.24 mg/dL Final   Creatinine,U  Date Value Ref Range Status  07/15/2021 128.3 mg/dL Final         Failed - K in normal range and within 180  days    Potassium  Date Value Ref Range Status  05/15/2022 4.4 3.5 - 5.1 mmol/L Final  05/15/2022 4.3 3.5 - 5.1 mmol/L Final         Failed - Valid encounter within last 6 months    Recent Outpatient Visits           9 months ago Low serum vitamin D    [facility], Ocie Bob, MD   10 months ago Hypertension, unspecified type   [facility] at [facility], Ocie Bob, MD   1 year ago Hyperlipidemia, unspecified hyperlipidemia type   [facility] at [facility], Ocie Bob, MD   1 year ago Cervical spondylolysis   [facility] at [facility], Ocie Bob, MD   1 year ago Abnormal thyroid stimulating hormone (TSH) level   [facility] & [facility] Medicine at [facility], Ocie Bob, MD              Passed - Patient is not pregnant      Passed - Last BP in normal range    BP Readings from Last 1 Encounters:  06/02/22 104/70

## 2023-02-27 NOTE — Telephone Encounter (Signed)
Requested medications are due for refill today.  yes  Requested medications are on the active medications list.  yes  Last refill. 01/17/2023  Future visit scheduled.   no  Notes to clinic.  Pt requested refill. Refill was refused  - pt needed to contact Endocrinologist.     Requested Prescriptions  Pending Prescriptions Disp Refills   metFORMIN (GLUCOPHAGE) 1000 MG tablet [Pharmacy Med Name: metFORMIN HCl 1000 MG Oral Tablet] 180 tablet 0    Sig: TAKE 1 TABLET BY MOUTH TWICE DAILY WITH MEALS     Endocrinology:  Diabetes - Biguanides Failed - 02/25/2023  2:11 PM      Failed - HBA1C is between 0 and 7.9 and within 180 days    Hemoglobin A1C  Date Value Ref Range Status  08/04/2022 7.5  Final         Failed - Valid encounter within last 6 months    Recent Outpatient Visits           9 months ago Low serum vitamin D   [place] [facility] Ashley Royalty, Ocie Bob, MD   10 months ago Hypertension, unspecified type   [place] [facility], Ocie Bob, MD   1 year ago Hyperlipidemia, unspecified hyperlipidemia type   [place] [facility], Ocie Bob, MD   1 year ago Cervical spondylolysis   [place] [facility], Ocie Bob, MD   1 year ago Abnormal thyroid stimulating hormone (TSH) level   [place] [facility] at [facility], Ocie Bob, MD              Failed - CBC within normal limits and completed in the last 12 months    WBC  Date Value Ref Range Status  05/15/2022 4.4 4.0 - 10.5 K/uL Final  05/15/2022 4.3 4.0 - 10.5 K/uL Final   RBC  Date Value Ref Range Status  05/15/2022 5.02 4.22 - 5.81 MIL/uL Final  05/15/2022 5.02 4.22 - 5.81 MIL/uL Final   Hemoglobin  Date Value Ref Range Status  05/15/2022 14.5 13.0 - 17.0 g/dL Final  21/30/8657 84.6 13.0 - 17.0  g/dL Final   HCT  Date Value Ref Range Status  05/15/2022 42.6 39.0 - 52.0 % Final  05/15/2022 42.8 39.0 - 52.0 % Final   MCHC  Date Value Ref Range Status  05/15/2022 34.0 30.0 - 36.0 g/dL Final  96/29/5284 13.2 30.0 - 36.0 g/dL Final   [facility]  Date Value Ref Range Status  05/15/2022 28.9 26.0 - 34.0 pg Final  05/15/2022 28.3 26.0 - 34.0 pg Final   MCV  Date Value Ref Range Status  05/15/2022 84.9 80.0 - 100.0 fL Final  05/15/2022 85.3 80.0 - 100.0 fL Final   No results found for: "PLTCOUNTKUC", "LABPLAT", "POCPLA" RDW  Date Value Ref Range Status  05/15/2022 12.5 11.5 - 15.5 % Final  05/15/2022 12.5 11.5 - 15.5 % Final         Passed - Cr in normal range and within 360 days    Creatinine, Ser  Date Value Ref Range Status  05/15/2022 0.92 0.61 - 1.24 mg/dL Final  44/11/270 5.36 0.61 - 1.24 mg/dL Final   Creatinine,U  Date Value Ref Range Status  07/15/2021 128.3 mg/dL Final         Passed - eGFR in  normal range and within 360 days    GFR calc Af Amer  Date Value Ref Range Status  12/03/2019 >60 >60 mL/min Final   GFR, Estimated  Date Value Ref Range Status  05/15/2022 >60 >60 mL/min Final    Comment:    (NOTE) Calculated using the CKD-EPI Creatinine Equation (2021)   05/15/2022 >60 >60 mL/min Final    Comment:    (NOTE) Calculated using the CKD-EPI Creatinine Equation (2021)    GFR  Date Value Ref Range Status  11/04/2018 80.10 >60.00 mL/min Final   eGFR  Date Value Ref Range Status  02/03/2022 104 >59 mL/min/1.73 Final         Passed - B12 Level in normal range and within 720 days    Vitamin B-12  Date Value Ref Range Status  07/15/2021 295 211 - 911 pg/mL Final         Refused Prescriptions Disp Refills   lisinopril (ZESTRIL) 5 MG tablet [Pharmacy Med Name: Lisinopril 5 MG Oral Tablet] 90 tablet 0    Sig: Take 1 tablet by mouth once daily     Cardiovascular:  ACE Inhibitors Failed - 02/25/2023  2:11 PM      Failed - Cr in normal range  and within 180 days    Creatinine, Ser  Date Value Ref Range Status  05/15/2022 0.92 0.61 - 1.24 mg/dL Final  40/98/1191 4.78 0.61 - 1.24 mg/dL Final   Creatinine,U  Date Value Ref Range Status  07/15/2021 128.3 mg/dL Final         Failed - K in normal range and within 180 days    Potassium  Date Value Ref Range Status  05/15/2022 4.4 3.5 - 5.1 mmol/L Final  05/15/2022 4.3 3.5 - 5.1 mmol/L Final         Failed - Valid encounter within last 6 months    Recent Outpatient Visits           9 months ago Low serum vitamin D   [place] [facility] Royalty, Ocie Bob, MD   10 months ago Hypertension, unspecified type   [facility], Ocie Bob, MD   1 year ago Hyperlipidemia, unspecified hyperlipidemia type   [facility], Ocie Bob, MD   1 year ago Cervical spondylolysis   [facility], Ocie Bob, MD   1 year ago Abnormal thyroid stimulating hormone (TSH) level   [facility] at [facility], Ocie Bob, MD              Passed - Patient is not pregnant      Passed - Last BP in normal range    BP Readings from Last 1 Encounters:  06/02/22 104/70

## 2023-02-27 NOTE — Telephone Encounter (Signed)
Pt needs appt for additional refills

## 2023-03-09 DIAGNOSIS — E785 Hyperlipidemia, unspecified: Secondary | ICD-10-CM | POA: Diagnosis not present

## 2023-03-09 DIAGNOSIS — E1165 Type 2 diabetes mellitus with hyperglycemia: Secondary | ICD-10-CM | POA: Diagnosis not present

## 2023-03-09 DIAGNOSIS — Z794 Long term (current) use of insulin: Secondary | ICD-10-CM | POA: Diagnosis not present

## 2023-05-30 ENCOUNTER — Other Ambulatory Visit: Payer: Self-pay | Admitting: Family Medicine

## 2023-05-30 DIAGNOSIS — E785 Hyperlipidemia, unspecified: Secondary | ICD-10-CM

## 2023-05-30 NOTE — Telephone Encounter (Signed)
Requested medication (s) are due for refill today: yes  Requested medication (s) are on the active medication list: yes  Last refill:  12/11/22 #90 1 RF  Future visit scheduled: no   Notes to clinic:  called pt and LM on VM: needs appt and lab work   Requested Prescriptions  Pending Prescriptions Disp Refills   rosuvastatin (CRESTOR) 10 MG tablet [Pharmacy Med Name: Rosuvastatin Calcium 10 MG Oral Tablet] 90 tablet 0    Sig: Take 1 tablet by mouth once daily     Cardiovascular:  Antilipid - Statins 2 Failed - 05/30/2023  6:45 AM      Failed - Cr in normal range and within 360 days    Creatinine, Ser  Date Value Ref Range Status  05/15/2022 0.92 0.61 - 1.24 mg/dL Final  78/29/5621 3.08 0.61 - 1.24 mg/dL Final   Creatinine,U  Date Value Ref Range Status  07/15/2021 128.3 mg/dL Final         Failed - Valid encounter within last 12 months    Recent Outpatient Visits           1 year ago Low serum vitamin D   [place] [facility] Royalty, Ocie Bob, MD   1 year ago Hypertension, unspecified type   [place] [facility], Ocie Bob, MD   1 year ago Hyperlipidemia, unspecified hyperlipidemia type   [place] [facility], Ocie Bob, MD   1 year ago Cervical spondylolysis   [facility], Ocie Bob, MD   1 year ago Abnormal thyroid stimulating hormone (TSH) level    [facility] at [facility], Ocie Bob, MD              Failed - Lipid Panel in normal range within the last 12 months    Cholesterol  Date Value Ref Range Status  05/15/2022 130 0 - 200 mg/dL Final  65/78/4696 295 0 - 200 mg/dL Final   LDL Cholesterol  Date Value Ref Range Status  05/15/2022 67 0 - 99 mg/dL Final    Comment:           Total Cholesterol/HDL:CHD Risk Coronary  Heart Disease Risk Table                     Men   Women  1/2 Average Risk   3.4   3.3  Average Risk       5.0   4.4  2 X Average Risk   9.6   7.1  3 X Average Risk  23.4   11.0        Use the calculated Patient Ratio above and the CHD Risk Table to determine the patient's CHD Risk.        ATP III CLASSIFICATION (LDL):  <100     mg/dL   Optimal  284-132  mg/dL   Near or Above                    Optimal  130-159  mg/dL   Borderline  440-102  mg/dL   High  >725     mg/dL   Very High Performed at [facility], [address] [place], [place] [address]   05/15/2022 69 0 - 99 mg/dL Final    Comment:  Total Cholesterol/HDL:CHD Risk Coronary Heart Disease Risk Table                     Men   Women  1/2 Average Risk   3.4   3.3  Average Risk       5.0   4.4  2 X Average Risk   9.6   7.1  3 X Average Risk  23.4   11.0        Use the calculated Patient Ratio above and the CHD Risk Table to determine the patient's CHD Risk.        ATP III CLASSIFICATION (LDL):  <100     mg/dL   Optimal  409-811  mg/dL   Near or Above                    Optimal  130-159  mg/dL   Borderline  914-782  mg/dL   High  >956     mg/dL   Very High Performed at [facility], [address] [place], [place] [address]    HDL  Date Value Ref Range Status  05/15/2022 51 >40 mg/dL Final  65/78/4696 49 >29 mg/dL Final   Triglycerides  Date Value Ref Range Status  05/15/2022 59 <150 mg/dL Final  52/84/1324 62 <401 mg/dL Final         Passed - Patient is not pregnant

## 2023-07-11 ENCOUNTER — Encounter: Payer: BC Managed Care – PPO | Admitting: Family Medicine

## 2023-07-13 DIAGNOSIS — Z794 Long term (current) use of insulin: Secondary | ICD-10-CM | POA: Diagnosis not present

## 2023-07-13 DIAGNOSIS — E785 Hyperlipidemia, unspecified: Secondary | ICD-10-CM | POA: Diagnosis not present

## 2023-07-13 DIAGNOSIS — I10 Essential (primary) hypertension: Secondary | ICD-10-CM | POA: Diagnosis not present

## 2023-07-13 DIAGNOSIS — E1165 Type 2 diabetes mellitus with hyperglycemia: Secondary | ICD-10-CM | POA: Diagnosis not present

## 2023-07-27 ENCOUNTER — Encounter: Payer: BC Managed Care – PPO | Admitting: Family Medicine

## 2023-08-03 ENCOUNTER — Ambulatory Visit (INDEPENDENT_AMBULATORY_CARE_PROVIDER_SITE_OTHER): Payer: BC Managed Care – PPO | Admitting: Family Medicine

## 2023-08-03 ENCOUNTER — Encounter: Payer: Self-pay | Admitting: Family Medicine

## 2023-08-03 VITALS — BP 138/86 | HR 80 | Ht 74.0 in | Wt 209.0 lb

## 2023-08-03 DIAGNOSIS — I10 Essential (primary) hypertension: Secondary | ICD-10-CM | POA: Diagnosis not present

## 2023-08-03 DIAGNOSIS — Z114 Encounter for screening for human immunodeficiency virus [HIV]: Secondary | ICD-10-CM

## 2023-08-03 DIAGNOSIS — E119 Type 2 diabetes mellitus without complications: Secondary | ICD-10-CM

## 2023-08-03 DIAGNOSIS — Z0000 Encounter for general adult medical examination without abnormal findings: Secondary | ICD-10-CM | POA: Diagnosis not present

## 2023-08-03 DIAGNOSIS — Z1159 Encounter for screening for other viral diseases: Secondary | ICD-10-CM

## 2023-08-03 DIAGNOSIS — Z125 Encounter for screening for malignant neoplasm of prostate: Secondary | ICD-10-CM

## 2023-08-03 DIAGNOSIS — E559 Vitamin D deficiency, unspecified: Secondary | ICD-10-CM | POA: Diagnosis not present

## 2023-08-03 DIAGNOSIS — E785 Hyperlipidemia, unspecified: Secondary | ICD-10-CM

## 2023-08-04 LAB — COMPREHENSIVE METABOLIC PANEL
ALT: 18 IU/L (ref 0–44)
AST: 18 IU/L (ref 0–40)
Albumin: 4.4 g/dL (ref 3.8–4.9)
Alkaline Phosphatase: 135 IU/L — ABNORMAL HIGH (ref 44–121)
BUN/Creatinine Ratio: 9 (ref 9–20)
BUN: 9 mg/dL (ref 6–24)
Bilirubin Total: 0.6 mg/dL (ref 0.0–1.2)
CO2: 24 mmol/L (ref 20–29)
Calcium: 9.8 mg/dL (ref 8.7–10.2)
Chloride: 102 mmol/L (ref 96–106)
Creatinine, Ser: 0.95 mg/dL (ref 0.76–1.27)
Globulin, Total: 2.4 g/dL (ref 1.5–4.5)
Glucose: 148 mg/dL — ABNORMAL HIGH (ref 70–99)
Potassium: 4.3 mmol/L (ref 3.5–5.2)
Sodium: 140 mmol/L (ref 134–144)
Total Protein: 6.8 g/dL (ref 6.0–8.5)
eGFR: 96 mL/min/{1.73_m2} (ref >59)

## 2023-08-04 LAB — LIPID PANEL
Chol/HDL Ratio: 3.2 ratio (ref 0.0–5.0)
Cholesterol, Total: 164 mg/dL (ref 100–199)
HDL: 51 mg/dL (ref >39)
LDL Chol Calc (NIH): 98 mg/dL (ref 0–99)
Triglycerides: 79 mg/dL (ref 0–149)
VLDL Cholesterol Cal: 15 mg/dL (ref 5–40)

## 2023-08-04 LAB — CBC
Hematocrit: 42.7 % (ref 37.5–51.0)
Hemoglobin: 14.3 g/dL (ref 13.0–17.7)
MCH: 28.7 pg (ref 26.6–33.0)
MCHC: 33.5 g/dL (ref 31.5–35.7)
MCV: 86 fL (ref 79–97)
Platelets: 196 10*3/uL (ref 150–450)
RBC: 4.99 x10E6/uL (ref 4.14–5.80)
RDW: 12.2 % (ref 11.6–15.4)
WBC: 3.2 10*3/uL — ABNORMAL LOW (ref 3.4–10.8)

## 2023-08-04 LAB — TSH: TSH: 0.418 u[IU]/mL — ABNORMAL LOW (ref 0.450–4.500)

## 2023-08-04 LAB — HEPATITIS C ANTIBODY: Hep C Virus Ab: NONREACTIVE

## 2023-08-04 LAB — HIV ANTIBODY (ROUTINE TESTING W REFLEX): HIV Screen 4th Generation wRfx: NONREACTIVE

## 2023-08-04 LAB — PSA TOTAL (REFLEX TO FREE): Prostate Specific Ag, Serum: 0.5 ng/mL (ref 0.0–4.0)

## 2023-08-04 LAB — VITAMIN D 25 HYDROXY (VIT D DEFICIENCY, FRACTURES): Vit D, 25-Hydroxy: 17.4 ng/mL — ABNORMAL LOW (ref 30.0–100.0)

## 2023-08-09 NOTE — Assessment & Plan Note (Signed)
Follows with [facility] endocrinology. Has self-discontinued Marcelline Deist due to insurance coverage barriers and uncertainty on need for medication. We reviewed his A1c trend and he had excellent control while on full regimen. We discussed importance of A1c monitoring with appropriate goals. He was encouraged to adhere to endocrinology regimen. We will continue to follow peripherally.

## 2023-08-09 NOTE — Patient Instructions (Addendum)
-   Obtain fasting labs with orders provided (can have water or black coffee but otherwise no food or drink x 8 hours before labs) - Review information provided - Attend eye doctor annually, dentist every 6 months, work towards or maintain 30 minutes of moderate intensity physical activity at least 5 days per week, and consume a balanced diet - Return in 1 year for physical - Contact us for any questions between now and then  Additionally review the following and contact us if ready to restart:  Statins (e.g., Atorvastatin, Rosuvastatin) Role: Cardiovascular protection. How it works: Lowers LDL cholesterol, reducing the risk of heart disease. Benefits: Reduces risk of heart attack and stroke in diabetics, regardless of cholesterol levels. Key Evidence: ADA and ACC recommend for all patients with diabetes aged 63+ or with additional CVD risk factors  ACE Inhibitors/ARBs (e.g., Lisinopril, Losartan) Role: Kidney protection and blood pressure control. How it works: Lowers blood pressure and protects against diabetic kidney disease. Benefits: Reduces progression of kidney disease in diabetes. Key Evidence: Recommended for patients with diabetes and hypertension or albuminuria

## 2023-08-09 NOTE — Assessment & Plan Note (Signed)
Annual examination completed, risk stratification labs ordered, anticipatory guidance provided.  We will follow labs once resulted.

## 2023-08-09 NOTE — Assessment & Plan Note (Signed)
In the setting of comorbid DM2, self-discontinued ACE. We had a lengthy discussion regarding the role of ACE/ARB in DM2. He will consider options and contact us for medication.

## 2023-08-09 NOTE — Progress Notes (Signed)
Annual Physical Exam Visit  Patient Information:  Patient ID: Spencer Barber, male DOB: [birth year] Age: 52 y.o. MRN: [id number]   Subjective:   CC: Annual Physical Exam  HPI:  Spencer Barber is here for their annual physical.  I reviewed the past medical history, family history, social history, surgical history, and allergies today and changes were made as necessary.  Please see the problem list section below for additional details.  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   Left varicocele 07/06/2014   Past Surgical History: Past Surgical History:  Procedure Laterality Date   COLONOSCOPY WITH PROPOFOL N/A 12/06/2020   Procedure: COLONOSCOPY WITH PROPOFOL;  Surgeon: Toney Reil, MD;  Location: [facility] ENDOSCOPY;  Service: Gastroenterology;  Laterality: N/A;   WISDOM TOOTH EXTRACTION     Family History: Family History  Problem Relation Age of Onset   Diabetes Mother    Alcohol abuse Father    Colon cancer Maternal Great-grandmother    Thyroid cancer Neg Hx    Allergies: No Known Allergies Health Maintenance: Health Maintenance  Topic Date Due   DTaP/Tdap/Td (2 - Tdap) 04/06/2019   FOOT EXAM  09/15/2021   Diabetic kidney evaluation - Urine ACR  07/15/2022   HEMOGLOBIN A1C  02/02/2023   OPHTHALMOLOGY EXAM  04/01/2023   COVID-19 Vaccine (4 - 2023-24 season) 07/15/2023   Diabetic kidney evaluation - eGFR measurement  08/02/2024   Colonoscopy  12/06/2030   Hepatitis C Screening  Completed   HIV Screening  Completed   Zoster Vaccines- Shingrix  Completed   HPV VACCINES  Aged Out   INFLUENZA VACCINE  Discontinued    HM Colonoscopy          Colonoscopy (Every 10 Years) Next due on 12/06/2030    12/06/2020  Surgical Procedure: COLONOSCOPY WITH PROPOFOL   Only the first 1 history entries have been loaded, but more  history exists.           Medications: Current Outpatient Medications on File Prior to Visit  Medication Sig Dispense Refill   Continuous Glucose Sensor (DEXCOM G7 SENSOR) MISC Dispense DexCom G7 sensors; Use 1 sensor q 10 days; *Patient is taking insulin     TRESIBA FLEXTOUCH 100 UNIT/ML FlexTouch Pen SMARTSIG:0-100 Unit(s) SUB-Q Daily     TRULICITY 3 MG/0.5ML SOPN SMARTSIG:3 Milligram(s) SUB-Q Once a Week     TRULICITY 4.5 MG/0.5ML SOPN Inject into the skin once a week.     ULTIGUARD SAFEPACK PEN NEEDLE 32G X 4 MM MISC Use to inject insulin once daily as directed.     FARXIGA 10 MG TABS tablet TAKE 1 TABLET BY MOUTH ONCE DAILY BEFORE BREAKFAST 90 tablet 0   No current facility-administered medications on file prior to visit.    Review of Systems: No headache, visual changes, nausea, vomiting, diarrhea, constipation, dizziness, abdominal pain, skin rash, fevers, chills, night sweats, swollen lymph nodes, weight loss, chest pain, body aches, joint swelling, muscle aches, shortness of breath, mood changes, visual or auditory hallucinations reported.  Objective:   Vitals:   08/03/23 0826  BP: 138/86  Pulse: 80  SpO2: 99%   Vitals:   08/03/23 0826  Weight: 209 lb (94.8 kg)  Height: 6\' 2"  (1.88 m)   Body mass index is 26.83 kg/m.  General: Well Developed, well nourished, and in no acute distress.  Neuro: Alert and  oriented x3, extra-ocular muscles intact, sensation grossly intact. Cranial nerves II through XII are grossly intact, motor, sensory, and coordinative functions are intact. HEENT: Normocephalic, atraumatic, pupils equal round reactive to light, neck supple, no masses, no lymphadenopathy, thyroid nonpalpable. Oropharynx, nasopharynx, external ear canals are unremarkable. Skin: Warm and dry, no rashes noted.  Cardiac: Regular rate and rhythm, no murmurs rubs or gallops. No peripheral edema. Pulses symmetric. Respiratory: Clear to auscultation bilaterally. Not using  accessory muscles, speaking in full sentences.  Abdominal: Soft, nontender, nondistended, positive bowel sounds, no masses, no organomegaly. Musculoskeletal: Shoulder, elbow, wrist, hip, knee, ankle stable, and with full range of motion.   Impression and Recommendations:   The patient was counselled, risk factors were discussed, and anticipatory guidance given.  Problem List Items Addressed This Visit       Cardiovascular and Mediastinum   HTN (hypertension)    In the setting of comorbid DM2, self-discontinued ACE. We had a lengthy discussion regarding the role of ACE/ARB in DM2. He will consider options and contact us for medication.      Relevant Orders   Comprehensive metabolic panel (Completed)   Lipid panel (Completed)     Endocrine   Diabetes mellitus (HCC)    Follows with [facility] endocrinology. Has self-discontinued Marcelline Deist due to insurance coverage barriers and uncertainty on need for medication. We reviewed his A1c trend and he had excellent control while on full regimen. We discussed importance of A1c monitoring with appropriate goals. He was encouraged to adhere to endocrinology regimen. We will continue to follow peripherally.      Relevant Orders   Comprehensive metabolic panel (Completed)     Other   HLD (hyperlipidemia)   Relevant Orders   Comprehensive metabolic panel (Completed)   Lipid panel (Completed)   Healthcare maintenance - Primary    Annual examination completed, risk stratification labs ordered, anticipatory guidance provided.  We will follow labs once resulted.      Relevant Orders   CBC (Completed)   Comprehensive metabolic panel (Completed)   Hepatitis C antibody (Completed)   HIV Antibody (routine testing w rflx) (Completed)   Lipid panel (Completed)   TSH (Completed)   PSA Total (Reflex To Free) (Completed)   VITAMIN D 25 Hydroxy (Vit-D Deficiency, Fractures) (Completed)   Other Visit Diagnoses     Screening for HIV (human immunodeficiency  virus)       Relevant Orders   HIV Antibody (routine testing w rflx) (Completed)   Need for hepatitis C screening test       Relevant Orders   Hepatitis C antibody (Completed)   Annual physical exam       Relevant Orders   CBC (Completed)   Comprehensive metabolic panel (Completed)   Hepatitis C antibody (Completed)   HIV Antibody (routine testing w rflx) (Completed)   Lipid panel (Completed)   TSH (Completed)   PSA Total (Reflex To Free) (Completed)   VITAMIN D 25 Hydroxy (Vit-D Deficiency, Fractures) (Completed)   Vitamin D deficiency       Relevant Orders   VITAMIN D 25 Hydroxy (Vit-D Deficiency, Fractures) (Completed)   Screening for prostate cancer       Relevant Orders   PSA Total (Reflex To Free) (Completed)        Orders & Medications Medications: No orders of the defined types were placed in this encounter.  Orders Placed This Encounter  Procedures   CBC   Comprehensive metabolic panel   Hepatitis C antibody   HIV Antibody (  routine testing w rflx)   Lipid panel   TSH   PSA Total (Reflex To Free)   VITAMIN D 25 Hydroxy (Vit-D Deficiency, Fractures)     No follow-ups on file.    Jerrol Banana, MD, [facility]   Primary Care Sports Medicine Primary Care and Sports Medicine at [facility]

## 2023-08-21 ENCOUNTER — Other Ambulatory Visit: Payer: Self-pay | Admitting: Family Medicine

## 2023-08-21 DIAGNOSIS — E559 Vitamin D deficiency, unspecified: Secondary | ICD-10-CM

## 2023-08-21 MED ORDER — VITAMIN D (ERGOCALCIFEROL) 1.25 MG (50000 UNIT) PO CAPS
50000.0000 [IU] | ORAL_CAPSULE | ORAL | 0 refills | Status: DC
Start: 2023-08-21 — End: 2023-10-16

## 2023-09-01 ENCOUNTER — Encounter: Payer: Self-pay | Admitting: Family Medicine

## 2023-09-08 ENCOUNTER — Other Ambulatory Visit: Payer: Self-pay | Admitting: Medical Genetics

## 2023-09-08 DIAGNOSIS — Z006 Encounter for examination for normal comparison and control in clinical research program: Secondary | ICD-10-CM

## 2023-10-12 ENCOUNTER — Other Ambulatory Visit: Payer: Self-pay | Admitting: Family Medicine

## 2023-10-12 DIAGNOSIS — E559 Vitamin D deficiency, unspecified: Secondary | ICD-10-CM

## 2023-10-16 ENCOUNTER — Other Ambulatory Visit: Payer: Self-pay

## 2023-10-16 NOTE — Telephone Encounter (Signed)
Requested medication (s) are due for refill today: yes  Requested medication (s) are on the active medication list: yes  Last refill:  08/21/23 #8  Future visit scheduled: yes  Notes to clinic:  med not delegated to NT to RF   Requested Prescriptions  Pending Prescriptions Disp Refills   Vitamin D, Ergocalciferol, (DRISDOL) 1.25 MG (50000 UNIT) CAPS capsule [Pharmacy Med Name: Vitamin D (Ergocalciferol) 1.25 MG (50000 UT) Oral Capsule] 8 capsule 0    Sig: Take 1 capsule by mouth once a week     Endocrinology:  Vitamins - Vitamin D Supplementation 2 Failed - 10/12/2023  9:30 AM      Failed - Manual Review: Route requests for 50,000 IU strength to the provider      Failed - Vitamin D in normal range and within 360 days    Vit D, 25-Hydroxy  Date Value Ref Range Status  08/03/2023 17.4 (L) 30.0 - 100.0 ng/mL Final    Comment:    Vitamin D deficiency has been defined by the Institute of Medicine and an Endocrine Society practice guideline as a level of serum 25-OH vitamin D less than 20 ng/mL (1,2). The Endocrine Society went on to further define vitamin D insufficiency as a level between 21 and 29 ng/mL (2). 1. IOM (Institute of Medicine). 2010. Dietary reference    intakes for calcium and D. [place]: The    [employer]. 2. Holick MF, Binkley Niles, Bischoff-Ferrari HA, et al.    Evaluation, treatment, and prevention of vitamin D    deficiency: an Endocrine Society clinical practice    guideline. JCEM. 2011 Jul; 96(7):1911-30.          Passed - Ca in normal range and within 360 days    Calcium  Date Value Ref Range Status  08/03/2023 9.8 8.7 - 10.2 mg/dL Final         Passed - Valid encounter within last 12 months    Recent Outpatient Visits           2 months ago Healthcare maintenance   [facility], Ocie Bob, MD   1 year ago Low serum vitamin D   [place] [facility], Ocie Bob, MD   1 year ago Hypertension, unspecified type   [facility], Ocie Bob, MD   1 year ago Hyperlipidemia, unspecified hyperlipidemia type   [facility], Ocie Bob, MD   1 year ago Cervical spondylolysis   [facility] Medicine at [facility], Ocie Bob, MD       Future Appointments             In 9 months Ashley Royalty, Ocie Bob, MD [facility] Primary Care & Sports Medicine at [facility], [facility]

## 2024-02-29 ENCOUNTER — Other Ambulatory Visit
Admission: RE | Admit: 2024-02-29 | Discharge: 2024-02-29 | Disposition: A | Discharge status: Home / Self Care | Payer: Self-pay | Source: Ambulatory Visit | Attending: Medical Genetics | Admitting: Medical Genetics

## 2024-02-29 ENCOUNTER — Other Ambulatory Visit: Payer: Self-pay | Admitting: Family Medicine

## 2024-02-29 DIAGNOSIS — Z006 Encounter for examination for normal comparison and control in clinical research program: Secondary | ICD-10-CM | POA: Insufficient documentation

## 2024-02-29 NOTE — Telephone Encounter (Signed)
 Please review

## 2024-03-03 ENCOUNTER — Other Ambulatory Visit: Payer: Self-pay | Admitting: Family Medicine

## 2024-03-03 DIAGNOSIS — E119 Type 2 diabetes mellitus without complications: Secondary | ICD-10-CM

## 2024-03-03 NOTE — Telephone Encounter (Signed)
 Please review. Pts message in the yellow box.  KP

## 2024-03-04 ENCOUNTER — Telehealth: Payer: Self-pay | Admitting: *Deleted

## 2024-03-04 NOTE — Progress Notes (Signed)
 Care Guide Pharmacy Note  03/04/2024 Name: Spencer Barber MRN: [id number] DOB: 23-Sep-1971  Referred By: Ma Saupe, MD Reason for referral: Complex Care Management and Call Attempt #1 (Outreach to schedule referral with pharmacist )   [name] is a 53 y.o. year old male who is a primary care patient of Augustus Ledger, Dessie Flow, MD.  Dorris Gaul was referred to the pharmacist for assistance related to: DMII  An unsuccessful telephone outreach was attempted today to contact the patient who was referred to the pharmacy team for assistance with medication assistance. Additional attempts will be made to contact the patient.  Kandis Ormond, CMA [place]  [facility], [facility] Guide Direct Dial: [phone]  Fax: [phone] Website: [email or web address]

## 2024-03-05 ENCOUNTER — Other Ambulatory Visit (HOSPITAL_COMMUNITY): Payer: Self-pay

## 2024-03-05 ENCOUNTER — Telehealth: Payer: Self-pay

## 2024-03-05 ENCOUNTER — Other Ambulatory Visit: Payer: Self-pay | Admitting: Pharmacist

## 2024-03-05 DIAGNOSIS — E1169 Type 2 diabetes mellitus with other specified complication: Secondary | ICD-10-CM

## 2024-03-05 NOTE — Progress Notes (Unsigned)
   03/05/2024 Name: Spencer Barber MRN: [id number] DOB: 1971/06/13  Chief Complaint  Patient presents with   Medication Assistance    Spencer Barber is a 53 y.o. year old male who presented for a telephone visit.   They were referred to the pharmacist by their PCP for assistance in managing medication access.   Conversation limited today as patient is not home  Subjective:  Care Team: Primary Care Provider: Ma Saupe, MD ; Next Scheduled Visit: 08/08/2024 Spencer Barber, Spencer Maltese, MD Endocrinology CPP: Spencer Barber   Patient reports cost of his Trulicity  and Dexcom G7 CGM have been unaffordable due to the deductible for his commercial insurance plan.  Reports taking his Horace Lye as directed by Spencer, but denies monitoring his home blood sugar recently due to cost of Dexcom G7  Reports has a glucometer, but denies checking blood sugar with this device. Reports that he has the monitor and test strips, but that he is out of lancets (does not recall the brand of device)  From review of chart, note patient seen by [facility] Spencer Barber. Last visit 07/13/2023 with recommendation for patient to follow up in 3 months - Today patient reports did not have follow up appointment as this was never scheduled  Collaborate with Spencer Barber to run test claims for patient.  - Reports per test claim, cost for Trulicity  is >$800 even with manufacturer copay savings card, due to his deductible - Per test claim, cost for Dexcom G7 sensors is ~$385    Assessment/Plan:   Recommend patient contact [facility] Endocrinology office today to let provider know that he is having difficulty with obtaining his medication/request an appointment and also request to speak with Endocrinology CPP regarding medication access/affordability - Patient confirms contacts [facility] and leaves a message for Spencer and CPP to let both know about his trouble  with medication access and concern about his blood sugar  Place coordination of care call to Spencer Barber Endocrinology. Leave message for CPP Spencer Barber to let pharmacist know that patient is having barrier to medication access and concern about his current blood sugar control. Advise that patient is also having difficulty with cost of Dexcom G7 CGM. Note Freestyle Libre 3 Plus CGM is available as an alternative if unable to overcome access barrier for Dexcom CGM  Recommend to pick up lancets and restart checking glucose with fingerstick check as currently unable to use CGM. Patient to contact office if needed for readings outside of established parameters or symptoms  Follow Up Plan: Clinical Pharmacist will follow up with patient by telephone within the next 2 weeks  Arthur Lash, PharmD, [facility] [phone]

## 2024-03-05 NOTE — Telephone Encounter (Signed)
 Pharmacy Patient Advocate Encounter  Insurance verification completed.   The patient is insured through [employer] claim for [place]. Currently a quantity of 6ml is a 30 day supply and the co-pay is $0 . The current 30 day co-pay is, $0.  No PA needed at this time.  This test claim was processed through [facility]- copay amounts may vary at other pharmacies due to pharmacy/plan contracts, or as the patient moves through the different stages of their insurance plan.

## 2024-03-06 ENCOUNTER — Telehealth: Payer: Self-pay

## 2024-03-06 ENCOUNTER — Other Ambulatory Visit (HOSPITAL_COMMUNITY): Payer: Self-pay

## 2024-03-06 NOTE — Telephone Encounter (Signed)
 Pharmacy Patient Advocate Encounter  Insurance verification completed.   The patient is insured through [employer]   Ran test claim for [employer]. Currently a quantity of 2ml is a 28 day supply and the co-pay is $0 .   This test claim was processed through [facility]- copay amounts may vary at other pharmacies due to pharmacy/plan contracts, or as the patient moves through the different stages of their insurance plan.

## 2024-03-07 ENCOUNTER — Other Ambulatory Visit: Payer: Self-pay | Admitting: Pharmacist

## 2024-03-07 DIAGNOSIS — E1169 Type 2 diabetes mellitus with other specified complication: Secondary | ICD-10-CM

## 2024-03-07 NOTE — Progress Notes (Signed)
   03/07/2024  Patient ID: Spencer Barber, male   DOB: 1971-02-17, 53 y.o.   MRN: [id number]  Outreach to patient today to follow up regarding medication access  Reports has been messaging with [facility] Endocrinology office.  Offer to investigate cost of therapeutic alternatives to Trulicity , but patient declines. He plans to see about picking up his Trulicity  prescription from pharmacy/paying his deductible  Denies having yet picked up lancets/checked blood sugar.  Recommend to pick up lancets and restart checking glucose with fingerstick check as currently unable to use CGM. Patient to contact office if needed for readings outside of established parameters or symptoms   Follow Up Plan:   Patient denies further medication questions or concerns Provide patient with contact information for clinic pharmacist to contact if needed in future for medication questions/concerns   Arthur Lash, PharmD, [facility] [phone]

## 2024-03-07 NOTE — Patient Instructions (Signed)
 Goals Addressed             This Visit's Progress    Pharmacy Goals       Please pick up lancets and restart monitoring your home blood sugar.   Please contact [facility] Endocrinologist to follow up.  [facility] Diabetes and Endocrinology [facility] [address]  [facility] Endocrinology  [place], [place] [address]  Phone: [phone]   Arthur Lash, PharmD, [facility] [phone]

## 2024-03-11 LAB — GENECONNECT MOLECULAR SCREEN: Genetic Analysis Overall Interpretation: NEGATIVE

## 2024-06-06 DIAGNOSIS — E785 Hyperlipidemia, unspecified: Secondary | ICD-10-CM | POA: Diagnosis not present

## 2024-06-06 DIAGNOSIS — I10 Essential (primary) hypertension: Secondary | ICD-10-CM | POA: Diagnosis not present

## 2024-06-06 DIAGNOSIS — Z794 Long term (current) use of insulin: Secondary | ICD-10-CM | POA: Diagnosis not present

## 2024-06-06 DIAGNOSIS — E1165 Type 2 diabetes mellitus with hyperglycemia: Secondary | ICD-10-CM | POA: Diagnosis not present

## 2024-08-08 ENCOUNTER — Ambulatory Visit (INDEPENDENT_AMBULATORY_CARE_PROVIDER_SITE_OTHER): Payer: Self-pay | Admitting: Family Medicine

## 2024-08-08 ENCOUNTER — Encounter: Payer: Self-pay | Admitting: Family Medicine

## 2024-08-08 VITALS — BP 100/60 | HR 91 | Ht 74.0 in | Wt 212.0 lb

## 2024-08-08 DIAGNOSIS — Z794 Long term (current) use of insulin: Secondary | ICD-10-CM | POA: Insufficient documentation

## 2024-08-08 DIAGNOSIS — I10 Essential (primary) hypertension: Secondary | ICD-10-CM

## 2024-08-08 DIAGNOSIS — E1169 Type 2 diabetes mellitus with other specified complication: Secondary | ICD-10-CM | POA: Diagnosis not present

## 2024-08-08 DIAGNOSIS — N529 Male erectile dysfunction, unspecified: Secondary | ICD-10-CM | POA: Diagnosis not present

## 2024-08-08 DIAGNOSIS — Z0000 Encounter for general adult medical examination without abnormal findings: Secondary | ICD-10-CM | POA: Diagnosis not present

## 2024-08-08 MED ORDER — TADALAFIL 5 MG PO TABS: 5.0000 mg | ORAL_TABLET | Freq: Every day | ORAL | 0 refills | Status: DC

## 2024-08-08 NOTE — Assessment & Plan Note (Signed)
 Hyperglycemia and diabetes management - Diabetes managed with [place], current dose 25 units in the morning (increased from 10 units) - Hemoglobin A1c was 9.7% in July - Uses a continuous glucose monitor but has difficulty with sensor replacements due to insurance deductibles - Financial barriers have limited access to Trulicity ; previously obtained a 90-day supply at reduced cost but has not had any since - No issues with [place] availability  Type 2 diabetes mellitus with medication access barriers and suboptimal control Suboptimal control with HbA1c of 9.7%. Financial barriers affecting Trulicity  adherence. Tresiba increased to 25 units. - Refer to care coordination for medication access assistance. - Monitor blood glucose regularly. - Maintain visits with endocrinology.

## 2024-08-08 NOTE — Patient Instructions (Addendum)
-   Obtain fasting labs with orders provided (can have water or black coffee but otherwise no food or drink x 8 hours before labs) - Review information provided - Attend eye doctor annually, dentist every 6 months, work towards or maintain 30 minutes of moderate intensity physical activity at least 5 days per week, and consume a balanced diet - Return in 1 year for physical - Contact us  for any questions between now and then   VISIT SUMMARY:  You had your annual physical exam today. We discussed your diabetes management, erectile dysfunction, and preventive care needs.  YOUR PLAN:  TYPE 2 DIABETES MELLITUS: Your diabetes is not well controlled, and financial barriers are affecting your access to some medications. -Continue taking your diabetes medications. -We will refer you to care coordination to help with medication access. -Monitor your blood glucose regularly.  ERECTILE DYSFUNCTION: You have had limited success with previous treatments for erectile dysfunction. -We will prescribe daily Cialis  for one month. -Follow up in one month to assess how well Cialis  is working. -If Cialis  is not effective, we will discuss other possible causes and treatments.  GENERAL ADULT MEDICAL EXAMINATION: We need to complete some preventive care checks. -You should schedule an eye examination at [facility]. -We will order a urine test to check your kidney function.

## 2024-08-08 NOTE — Progress Notes (Signed)
 Annual Physical Exam Visit  Patient Information:  Patient ID: Spencer Barber, male DOB: 1971/10/18 Age: 53 y.o. MRN: [id number]   Subjective:   CC: Annual Physical Exam  HPI:  Spencer Barber is here for their annual physical.  I reviewed the past medical history, family history, social history, surgical history, and allergies today and changes were made as necessary.  Please see the problem list section below for additional details.  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   Left varicocele 07/06/2014   Past Surgical History: Past Surgical History:  Procedure Laterality Date   COLONOSCOPY WITH PROPOFOL  N/A 12/06/2020   Procedure: COLONOSCOPY WITH PROPOFOL ;  Surgeon: Unk Corinn Skiff, MD;  Location: [facility] ENDOSCOPY;  Service: Gastroenterology;  Laterality: N/A;   WISDOM TOOTH EXTRACTION     Family History: Family History  Problem Relation Age of Onset   Diabetes Mother    Alcohol abuse Father    Colon cancer Maternal Great-grandmother    Thyroid  cancer Neg Hx    Allergies: No Known Allergies Health Maintenance: Health Maintenance  Topic Date Due   Pneumococcal Vaccine: 50+ Years (1 of 2 - PCV) Never done   Diabetic kidney evaluation - Urine ACR  01/29/2022   HEMOGLOBIN A1C  01/31/2024   Diabetic kidney evaluation - eGFR measurement  08/02/2024   FOOT EXAM  09/07/2024 (Originally 08/02/2024)   OPHTHALMOLOGY EXAM  09/07/2024 (Originally 08/02/2024)   Colonoscopy  12/06/2030   Hepatitis B Vaccines 19-59 Average Risk  Completed   Hepatitis C Screening  Completed   HIV Screening  Completed   Zoster Vaccines- Shingrix  Completed   HPV VACCINES  Aged Out   Meningococcal B Vaccine  Aged Out   DTaP/Tdap/Td  Discontinued   Influenza Vaccine  Discontinued   COVID-19 Vaccine  Discontinued    HM Colonoscopy          Upcoming      Colonoscopy (Every 10 Years) Next due on 12/06/2030    12/06/2020  Surgical Procedure: COLONOSCOPY WITH PROPOFOL    Only the first 1 history entries have been loaded, but more history exists.               Medications: Current Outpatient Medications on File Prior to Visit  Medication Sig Dispense Refill   Continuous Glucose Sensor (DEXCOM G7 SENSOR) MISC Dispense DexCom G7 sensors; Use 1 sensor q 10 days; *Patient is taking insulin     TRESIBA FLEXTOUCH 100 UNIT/ML FlexTouch Pen SMARTSIG:0-100 Unit(s) SUB-Q Daily     TRULICITY  4.5 MG/0.5ML SOPN Inject into the skin once a week.     ULTIGUARD SAFEPACK PEN NEEDLE 32G X 4 MM MISC Use to inject insulin once daily as directed.     No current facility-administered medications on file prior to visit.    Discussed the use of AI scribe software for clinical note transcription with the patient, who gave verbal consent to proceed.   Objective:   Vitals:   08/08/24 0750  BP: 100/60  Pulse: 91  SpO2: 99%   Vitals:   08/08/24 0750  Weight: 212 lb (96.2 kg)  Height: 6' 2 (1.88 m)   Body mass index is 27.22 kg/m.  General: Well Developed, well nourished, and in no acute distress.  Neuro: Alert and oriented x3, extra-ocular muscles intact, sensation grossly intact. Cranial nerves II through XII are grossly intact,  motor, sensory, and coordinative functions are intact. HEENT: Normocephalic, atraumatic, neck supple, no masses, no lymphadenopathy, thyroid  nonenlarged. Oropharynx, nasopharynx, external ear canals are unremarkable. Skin: Warm and dry, no rashes noted.  Cardiac: Regular rate and rhythm, no murmurs rubs or gallops. No peripheral edema. Pulses symmetric. Respiratory: Clear to auscultation bilaterally. Speaking in full sentences.  Abdominal: Soft, nontender, nondistended, positive bowel sounds, no masses, no organomegaly. Musculoskeletal: Stable, and with full range of motion.  Impression and Recommendations:   The  patient was counselled, risk factors were discussed, and anticipatory guidance given.  Problem List Items Addressed This Visit     Diabetes mellitus (HCC)   Relevant Orders   Urine Microalbumin w/creat. ratio   AMB Referral VBCI Care Management   Erectile dysfunction   Erectile dysfunction - Erectile dysfunction with prior urology evaluation - No use of Cialis  or similar phosphodiesterase inhibitors, but open to treatment options - Past use of direct injection therapy, initially effective but later ineffective  Erectile dysfunction Potential multifactorial etiology. Previous treatments had limited success. - Prescribe daily Cialis  for one month. - Follow-up in one month to assess Cialis  response. - Discuss psychological factors if Cialis  ineffective.      Relevant Medications   tadalafil  (CIALIS ) 5 MG tablet   Healthcare maintenance - Primary   Relevant Orders   CBC   Comprehensive metabolic panel with GFR   Hemoglobin A1c   Lipid panel   PSA Total (Reflex To Free)   HTN (hypertension)   Orthostatic symptoms - No symptoms of low blood pressure, including lightheadedness, giddiness, or blurred vision      Relevant Medications   tadalafil  (CIALIS ) 5 MG tablet   Long-term insulin use (HCC)   Hyperglycemia and diabetes management - Diabetes managed with Tresiba, current dose 25 units in the morning (increased from 10 units) - Hemoglobin A1c was 9.7% in July - Uses a continuous glucose monitor but has difficulty with sensor replacements due to insurance deductibles - Financial barriers have limited access to Trulicity ; previously obtained a 90-day supply at reduced cost but has not had any since - No issues with [place] availability  Type 2 diabetes mellitus with medication access barriers and suboptimal control Suboptimal control with HbA1c of 9.7%. Financial barriers affecting Trulicity  adherence. Tresiba increased to 25 units. - Refer to care coordination for medication  access assistance. - Monitor blood glucose regularly. - Maintain visits with endocrinology.      Relevant Orders   Urine Microalbumin w/creat. ratio   AMB Referral VBCI Care Management     Orders & Medications Medications:  Meds ordered this encounter  Medications   tadalafil  (CIALIS ) 5 MG tablet    Sig: Take 1 tablet (5 mg total) by mouth daily.    Dispense:  30 tablet    Refill:  0   Orders Placed This Encounter  Procedures   CBC   Comprehensive metabolic panel with GFR   Hemoglobin A1c   Lipid panel   PSA Total (Reflex To Free)   Urine Microalbumin w/creat. ratio   AMB Referral VBCI Care Management     No follow-ups on file.    Selinda JINNY Ku, MD, [facility]   Primary Care Sports Medicine Primary Care and Sports Medicine at [facility]

## 2024-08-08 NOTE — Assessment & Plan Note (Signed)
 Orthostatic symptoms - No symptoms of low blood pressure, including lightheadedness, giddiness, or blurred vision

## 2024-08-08 NOTE — Assessment & Plan Note (Addendum)
 Erectile dysfunction - Erectile dysfunction with prior urology evaluation - No use of Cialis  or similar phosphodiesterase inhibitors, but open to treatment options - Past use of direct injection therapy, initially effective but later ineffective  Erectile dysfunction Potential multifactorial etiology. Previous treatments had limited success. - Prescribe daily Cialis  for one month. - Follow-up in one month to assess Cialis  response. - Discuss psychological factors if Cialis  ineffective.

## 2024-08-09 LAB — COMPREHENSIVE METABOLIC PANEL WITH GFR
ALT: 18 IU/L (ref 0–44)
AST: 20 IU/L (ref 0–40)
Albumin: 4.5 g/dL (ref 3.8–4.9)
Alkaline Phosphatase: 116 IU/L (ref 47–123)
BUN/Creatinine Ratio: 9 (ref 9–20)
BUN: 10 mg/dL (ref 6–24)
Bilirubin Total: 0.9 mg/dL (ref 0.0–1.2)
CO2: 24 mmol/L (ref 20–29)
Calcium: 9.5 mg/dL (ref 8.7–10.2)
Chloride: 100 mmol/L (ref 96–106)
Creatinine, Ser: 1.12 mg/dL (ref 0.76–1.27)
Globulin, Total: 2.5 g/dL (ref 1.5–4.5)
Glucose: 131 mg/dL — ABNORMAL HIGH (ref 70–99)
Potassium: 4.3 mmol/L (ref 3.5–5.2)
Sodium: 139 mmol/L (ref 134–144)
Total Protein: 7 g/dL (ref 6.0–8.5)
eGFR: 79 mL/min/1.73 (ref >59)

## 2024-08-09 LAB — CBC
Hematocrit: 40.5 % (ref 37.5–51.0)
Hemoglobin: 14.1 g/dL (ref 13.0–17.7)
MCH: 29.3 pg (ref 26.6–33.0)
MCHC: 34.8 g/dL (ref 31.5–35.7)
MCV: 84 fL (ref 79–97)
Platelets: 170 x10E3/uL (ref 150–450)
RBC: 4.81 x10E6/uL (ref 4.14–5.80)
RDW: 12.4 % (ref 11.6–15.4)
WBC: 8.5 x10E3/uL (ref 3.4–10.8)

## 2024-08-09 LAB — PSA TOTAL (REFLEX TO FREE): Prostate Specific Ag, Serum: 0.6 ng/mL (ref 0.0–4.0)

## 2024-08-09 LAB — LIPID PANEL
Chol/HDL Ratio: 2.5 ratio (ref 0.0–5.0)
Cholesterol, Total: 142 mg/dL (ref 100–199)
HDL: 57 mg/dL (ref >39)
LDL Chol Calc (NIH): 75 mg/dL (ref 0–99)
Triglycerides: 41 mg/dL (ref 0–149)
VLDL Cholesterol Cal: 10 mg/dL (ref 5–40)

## 2024-08-09 LAB — HEMOGLOBIN A1C
Est. average glucose Bld gHb Est-mCnc: 174 mg/dL
Hgb A1c MFr Bld: 7.7 % — ABNORMAL HIGH (ref 4.8–5.6)

## 2024-08-09 LAB — MICROALBUMIN / CREATININE URINE RATIO
Creatinine, Urine: 150.8 mg/dL
Microalb/Creat Ratio: 18 mg/g{creat} (ref 0–29)
Microalbumin, Urine: 27.6 ug/mL

## 2024-08-10 ENCOUNTER — Ambulatory Visit: Payer: Self-pay | Admitting: Family Medicine

## 2024-08-11 ENCOUNTER — Telehealth: Payer: Self-pay

## 2024-08-11 NOTE — Progress Notes (Signed)
 Care Guide Pharmacy Note  08/11/2024 Name: Slade Pierpoint MRN: [id number] DOB: 09/16/1971  Referred By: Alvia Selinda PARAS, MD Reason for referral: Complex Care Management (Outreach to schedule with Pharm d )   Geran Haithcock is a 53 y.o. year old male who is a primary care patient of Alvia Selinda PARAS, MD.  Gregg Louder was referred to the pharmacist for assistance related to: DMII  Successful contact was made with the patient to discuss pharmacy services including being ready for the pharmacist to call at least 5 minutes before the scheduled appointment time and to have medication bottles and any blood pressure readings ready for review. The patient agreed to meet with the pharmacist via telephone visit on (date/time).08/19/2024  Jeoffrey Buffalo , RMA     [place]  [facility], [facility] Guide  Direct Dial: [phone]  Website: [email or web address]

## 2024-08-13 ENCOUNTER — Telehealth: Payer: Self-pay

## 2024-08-13 NOTE — Telephone Encounter (Signed)
 Spoke with patient he has not had diabetic eye exam this year. He is working on getting that scheduled.  JM

## 2024-08-15 ENCOUNTER — Other Ambulatory Visit: Payer: Self-pay | Admitting: Pharmacist

## 2024-08-15 ENCOUNTER — Telehealth: Payer: Self-pay | Admitting: Pharmacist

## 2024-08-15 NOTE — Progress Notes (Signed)
   Outreach Note  08/15/2024 Name: Spencer Barber MRN: [id number] DOB: 01/22/1971  Referred by: Alvia Selinda PARAS, MD  Was unable to reach patient via telephone today and have left HIPAA compliant voicemail asking patient to return my call.    Follow Up Plan: Will collaborate with Care Guide to outreach to reschedule appointment with me  Sharyle Sia, PharmD, [facility] [phone]

## 2024-08-18 ENCOUNTER — Telehealth: Payer: Self-pay | Admitting: Pharmacist

## 2024-08-18 NOTE — Progress Notes (Signed)
   Outreach Note  08/18/2024 Name: Olyn Landstrom MRN: [id number] DOB: May 19, 1971  Referred by: Alvia Selinda PARAS, MD   Was unable to reach patient via telephone today and have left HIPAA compliant voicemail asking patient to return my call. Outreach attempt #2.   Follow Up Plan: Will collaborate with Care Guide to outreach to reschedule appointment with me  Sharyle Sia, PharmD, [facility] [phone]

## 2024-08-19 ENCOUNTER — Other Ambulatory Visit: Payer: Self-pay

## 2024-08-19 ENCOUNTER — Other Ambulatory Visit

## 2024-08-19 ENCOUNTER — Other Ambulatory Visit (HOSPITAL_BASED_OUTPATIENT_CLINIC_OR_DEPARTMENT_OTHER): Payer: Self-pay

## 2024-08-19 NOTE — Progress Notes (Unsigned)
 08/19/2024 Name: Linkin Vizzini MRN: [id number] DOB: 06-15-1971  Chief Complaint  Patient presents with   Medication Management   Diabetes    Lazar Plaskett is a 53 y.o. year old male who presented for a telephone visit. They were referred to the pharmacist by their PCP for assistance in managing diabetes and medication access.    Subjective: Spoke with Cam over the phone today in regards to recent issues with medication access for Trulicity . He mentions that he has a high deductible to meet each year, making it challenging for him to pay out of pocket at the beginning of the year. He was able to pick up a 90-day supply for ~$500-600 in July. He has one pen remaining. Reports that he is being charged over $2,000 foTrulicity  when he tries to pick it up from the pharmacy now. Per test billing, a 28-day supply of Trulicity  would be approximately $125. He states that this is an affordable price to him, and he would like to transition to a mail-order pharmacy at [facility] to help facilitate refills and delivery.   He has previously been on Farxiga , metformin , linagliptin , and sitagliptin . He reports frequent urination on Farxiga . States that he prefers not being on oral medications. He is not interested in oral medication therapy at this time.   Care Team: Primary Care Provider: Alvia Selinda PARAS, MD ; Next Scheduled Visit: 09/05/2024  Medication Access/Adherence Current Pharmacy:  [facility] [address] [place] - [address] [place] [address] Phone: [phone] Fax: [phone]   Patient reports affordability concerns with their medications: Yes  Patient reports access/transportation concerns to their pharmacy: No  Patient reports adherence concerns with their medications:  Yes  - due to cost    Diabetes: Current medications: Trulicity  4.5 mg weekly, Tresiba 25 units daily Medications tried in the past: Farxiga  (frequent urination), metformin ,  linagliptin , sitagliptin   Patient denies hypoglycemic s/sx including dizziness, shakiness, sweating. Patient denies hyperglycemic symptoms including polyuria, polydipsia, polyphagia, nocturia, neuropathy, blurred vision.  Current meal patterns:  Discussed meal planning options and Plate method for healthy eating Avoid sugary drinks and desserts Incorporate balanced protein, non starchy veggies, 1 serving of carbohydrate with each meal Increase water intake Increase physical activity as able  Current medication access support: BCBS  Macrovascular and Microvascular Risk Reduction: Statin? no; patient declined statin therapy ACEi/ARB? no; patient declined therapy Last urinary albumin/creatinine ratio:  Lab Results  Component Value Date   MICRALBCREAT 18 08/08/2024   MICRALBCREAT 8 01/29/2021   MICRALBCREAT 19 08/28/2020   MICRALBCREAT 20 11/16/2019   Last eye exam:  Lab Results  Component Value Date   HMDIABEYEEXA No Retinopathy 03/31/2022   Last foot exam: No foot exam found Tobacco Use:  Tobacco Use: Low Risk  (08/08/2024)   Patient History    Smoking Tobacco Use: Never    Smokeless Tobacco Use: Never    Passive Exposure: Not on file    Objective: Lab Results  Component Value Date   HGBA1C 7.7 (H) 08/08/2024   Lab Results  Component Value Date   CREATININE 1.12 08/08/2024   BUN 10 08/08/2024   NA 139 08/08/2024   K 4.3 08/08/2024   CL 100 08/08/2024   CO2 24 08/08/2024   Lab Results  Component Value Date   CHOL 142 08/08/2024   HDL 57 08/08/2024   LDLCALC 75 08/08/2024   TRIG 41 08/08/2024   CHOLHDL 2.5 08/08/2024   Medications Reviewed Today  Reviewed by Bernette Falling, [facility] (Pharmacist) on 08/19/24 at 1906  Med List Status: <None>   Medication Order Taking? Sig Documenting Provider Last Dose Status Informant  Continuous Glucose Sensor (DEXCOM G7 SENSOR) MISC [id number]  Dispense DexCom G7 sensors; Use 1 sensor q 10 days; *Patient is taking insulin  [provider]  Active   tadalafil  (CIALIS ) 5 MG tablet [id number]  Take 1 tablet (5 mg total) by mouth daily. Matthews, Jason J, MD  Active   TRESIBA FLEXTOUCH 100 UNIT/ML FlexTouch Pen [id number] Yes SMARTSIG:0-100 Unit(s) SUB-Q Daily [provider]  Active   TRULICITY  4.5 MG/0.5ML SOPN [id number] Yes Inject into the skin once a week. [provider]  Active   ULTIGUARD SAFEPACK PEN NEEDLE 32G X 4 MM MISC [id number]  Use to inject insulin once daily as directed. [provider]  Active             Assessment/Plan:  Diabetes: - Currently uncontrolled; goal A1c <7%. Cardiorenal risk reduction is opportunities for improvement.. Blood pressure is at goal <130/80. LDL is at goal.  - Reviewed long term cardiovascular and renal outcomes of uncontrolled blood sugar. and Reviewed goal A1c, goal fasting, and goal 2 hour post prandial glucose. Recommended to check glucose daily - Recommend to continue Trulicity  4.5 mg weekly and Tresiba 25 units daily. - Ran a test claim on Trulcity 4.5 mg weekly with his insurance and co-pay card, estimated cost for a 30-day supply is approximately $125. He states that this is an affordable price and will like to use [employer].  - Will route to embedded pharmacist to send Trulicity  Rx into [facility] for mail-order.  - Future considerations: oral diabetes medications (e.g., SGLT2i, metformin ); will benefit from statin therapy (not interested in any oral medication at this time)  Co-pay card:    Follow Up Plan: PCP 09/05/2024  Falling Bernette, PharmD PGY1 Pharmacy Resident  08/19/2024

## 2024-08-20 ENCOUNTER — Other Ambulatory Visit (HOSPITAL_COMMUNITY): Payer: Self-pay

## 2024-08-20 ENCOUNTER — Telehealth: Payer: Self-pay

## 2024-08-20 MED ORDER — TRULICITY 4.5 MG/0.5ML ~~LOC~~ SOAJ
4.5000 mg | SUBCUTANEOUS | 3 refills | Status: AC
  Filled 2024-08-20 – 2024-09-08 (×2): qty 2, 28d supply, fill #0

## 2024-08-20 NOTE — Telephone Encounter (Signed)
 Pharmacy Patient Advocate Encounter  Insurance verification completed.   The patient is insured through [facility]   Ran test claim for Tru;icity. Currently a quantity of 2 is a ml day supply and the co-pay is $121.37 .   This test claim was processed through [facility]- copay amounts may vary at other pharmacies due to pharmacy/plan contracts, or as the patient moves through the different stages of their insurance plan.    With coupon:

## 2024-08-29 ENCOUNTER — Other Ambulatory Visit: Payer: Self-pay | Admitting: Pharmacist

## 2024-08-29 ENCOUNTER — Other Ambulatory Visit (HOSPITAL_COMMUNITY): Payer: Self-pay

## 2024-08-29 DIAGNOSIS — E119 Type 2 diabetes mellitus without complications: Secondary | ICD-10-CM | POA: Diagnosis not present

## 2024-08-29 DIAGNOSIS — E1169 Type 2 diabetes mellitus with other specified complication: Secondary | ICD-10-CM

## 2024-08-29 LAB — OPHTHALMOLOGY REPORT-SCANNED

## 2024-08-29 NOTE — Progress Notes (Signed)
   08/29/2024  Patient ID: Spencer Barber, male   DOB: Nov 27, 1970, 53 y.o.   MRN: [id number]  Receive a voicemail from patient requesting a call back.  Note patient spoke with Pharmacist Katrina Bitcon on 08/19/2024 regarding medication access/cost of Trulicity   Today collaborate with Katrina and [facility]. Find that patient's cost for 1 month supply of his Trulicity  through his BCBS coverage is [phone], with manufacturer copay savings card, this cost is reduced to $813.   Follow up with patient by telephone. Conversation today is limited as patient is not currently home, rather out in a restaurant, but states prefers to speak now rather than have pharmacist call back. 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 - Patient states that he is in contact with [facility] Endocrinology CPP Arlean Andrews and will send her a MyChart message to discuss Trulicity  cost barrier/consider alternative therapies.  Follow Up Plan:    Patient denies further medication questions or concerns Provide patient with contact information for clinic pharmacist to contact if needed in future for medication questions/concerns   Sharyle Sia, PharmD, [facility] [phone]

## 2024-08-29 NOTE — Patient Instructions (Signed)
 Goals Addressed             This Visit's Progress    Pharmacy Goals       Please contact [facility] Endocrinologist to follow up.  [facility] [address]  [facility]inology  [place], [place] [address]  Phone: [phone]    Sharyle Sia, PharmD, [facility] [phone]

## 2024-08-30 ENCOUNTER — Other Ambulatory Visit (HOSPITAL_COMMUNITY): Payer: Self-pay

## 2024-09-05 ENCOUNTER — Encounter: Payer: Self-pay | Admitting: Family Medicine

## 2024-09-05 ENCOUNTER — Ambulatory Visit (INDEPENDENT_AMBULATORY_CARE_PROVIDER_SITE_OTHER): Admitting: Family Medicine

## 2024-09-05 VITALS — BP 110/58 | HR 65 | Temp 98.4°F | Ht 74.0 in | Wt 222.0 lb

## 2024-09-05 DIAGNOSIS — Z794 Long term (current) use of insulin: Secondary | ICD-10-CM | POA: Diagnosis not present

## 2024-09-05 DIAGNOSIS — E1169 Type 2 diabetes mellitus with other specified complication: Secondary | ICD-10-CM | POA: Diagnosis not present

## 2024-09-05 DIAGNOSIS — N529 Male erectile dysfunction, unspecified: Secondary | ICD-10-CM | POA: Diagnosis not present

## 2024-09-05 MED ORDER — TADALAFIL 10 MG PO TABS: 10.0000 mg | ORAL_TABLET | ORAL | 1 refills | Status: DC | PRN

## 2024-09-05 NOTE — Assessment & Plan Note (Signed)
 Diabetes mellitus management challenges - Unable to afford Trulicity  due to high insurance deductibles - Managing diabetes without medication, resulting in elevated blood glucose levels - Seeking more affordable alternative diabetes treatments  Type 2 diabetes mellitus Chronic type 2 diabetes with medication affordability issues. Current medication, Trulicity , is cost-prohibitive. Emphasized prioritizing diabetes management to prevent complications. - Refer to endocrinology for evaluation of affordable diabetes medications. - Encourage call to schedule endocrinology appointments on Fridays. - Advise discussing financially sustainable medication regimen with endocrinology.

## 2024-09-05 NOTE — Patient Instructions (Signed)
 VISIT SUMMARY:  You came in for a follow-up regarding your erectile dysfunction and diabetes management. We discussed changes to your medication and referrals to specialists for further evaluation.  YOUR PLAN:  ERECTILE DYSFUNCTION: You have been experiencing persistent erectile dysfunction despite using Cialis  daily. Erections are short-lived and primarily associated with the need to urinate. Previous trials of Viagra were also unsuccessful. -Stop taking tadalafil  (Cialis ) 5 mg daily. -Start taking tadalafil  10 mg, 1-2 tablets as needed before sexual activity. -You will be referred to a urologist for further evaluation and management.  TYPE 2 DIABETES MELLITUS: You are managing your diabetes without medication due to the high cost of Trulicity , resulting in elevated blood glucose levels. -You will be referred to a local endocrinologist to evaluate more affordable diabetes medications. -Try calling to schedule endocrinology appointments on Fridays. -Discuss a financially sustainable medication regimen with the endocrinologist once you see them.

## 2024-09-05 NOTE — Progress Notes (Addendum)
 Primary Care / Sports Medicine Office Visit  Patient Information:  Patient ID: Spencer Barber, male DOB: 06-17-1971 Age: 53 y.o. MRN: [id number]   Spencer Barber is a pleasant 53 y.o. male presenting with the following:  Chief Complaint  Patient presents with   Medical Management of Chronic Issues    Patient following up about taking cialis . No improvement with daily use.     Vitals:   09/05/24 1019  BP: (!) 110/58  Pulse: 65  Temp: 98.4 F (36.9 C)   Vitals:   09/05/24 1019  Weight: 222 lb (100.7 kg)  Height: 6' 2 (1.88 m)   Body mass index is 28.5 kg/m.  No results found.   Discussed the use of AI scribe software for clinical note transcription with the patient, who gave verbal consent to proceed.   Independent interpretation of notes and tests performed by another provider:   None  Procedures performed:   None  Pertinent History, Exam, Impression, and Recommendations:   Problem List Items Addressed This Visit     Diabetes mellitus (HCC)   Diabetes mellitus management challenges - Unable to afford Trulicity  due to high insurance deductibles - Managing diabetes without medication, resulting in elevated blood glucose levels - Seeking more affordable alternative diabetes treatments  Type 2 diabetes mellitus Chronic type 2 diabetes with medication affordability issues. Current medication, Trulicity , is cost-prohibitive. Emphasized prioritizing diabetes management to prevent complications. - Refer to endocrinology for evaluation of affordable diabetes medications. - Encourage call to schedule endocrinology appointments on Fridays. - Advise discussing financially sustainable medication regimen with endocrinology.      Relevant Orders   Ambulatory referral to Endocrinology   Erectile dysfunction - Primary   History of Present Illness Spencer Barber is a 53 year old male with erectile dysfunction who presents for follow-up regarding  treatment.  Erectile dysfunction - Persistent erectile dysfunction despite daily Cialis  5 mg use - Erections are short-lived and primarily associated with the need to urinate, rather than sexual arousal - Morning and daytime penile stiffness prior to urination, insufficient for intercourse - Previous trial of sildenafil (Viagra) was unsuccessful - Surgical options and vacuum erection devices were discussed previously by Urology in 2023 but not pursued due to personal and partner preference  Adverse effects of phosphodiesterase-5 inhibitor therapy - Penile stiffness prior to urination began after starting daily Cialis  5 mg - No improvement in erectile function with current medication regimen  Occupational limitations - Works in [employer] representative - Limited availability for medical appointments due to work schedule  Assessment and Plan Erectile dysfunction Chronic erectile dysfunction with limited response to tadalafil  5 mg. Previous sildenafil trials unsuccessful. Potential structural issues require urology evaluation. Discussed increasing tadalafil  dosage under urology supervision. - Discontinue tadalafil  5 mg daily. - Prescribe tadalafil  10 mg, 1-2 tablets as needed before sexual activity. - Refer to urology for further evaluation and management.      Relevant Medications   tadalafil  (CIALIS ) 10 MG tablet   Other Relevant Orders   Ambulatory referral to Urology   Long-term insulin use [facility])   Relevant Orders   Ambulatory referral to Endocrinology   A total of 42 minutes was spent on the date of service, 09/05/2024, encompassing both face-to-face and non-face-to-face time. This included review of prior records and imaging (e.g., MRI and/or radiographs), medical chart review, information gathering, documentation, care coordination with clinic staff, discussion and counseling with the patient regarding clinical findings and treatment options, and planning for follow-up and next steps  in  management.   Orders & Medications Medications:  Meds ordered this encounter  Medications   tadalafil  (CIALIS ) 10 MG tablet    Sig: Take 1-2 tablets (10-20 mg total) by mouth every other day as needed for erectile dysfunction.    Dispense:  20 tablet    Refill:  1   Orders Placed This Encounter  Procedures   Ambulatory referral to Urology   Ambulatory referral to Endocrinology     No follow-ups on file.     Selinda JINNY Ku, MD, [facility]   Primary Care Sports Medicine Primary Care and Sports Medicine at [facility]

## 2024-09-05 NOTE — Assessment & Plan Note (Signed)
 History of Present Illness Spencer Barber is a 53 year old male with erectile dysfunction who presents for follow-up regarding treatment.  Erectile dysfunction - Persistent erectile dysfunction despite daily Cialis  5 mg use - Erections are short-lived and primarily associated with the need to urinate, rather than sexual arousal - Morning and daytime penile stiffness prior to urination, insufficient for intercourse - Previous trial of sildenafil (Viagra) was unsuccessful - Surgical options and vacuum erection devices were discussed previously by Urology in 2023 but not pursued due to personal and partner preference  Adverse effects of phosphodiesterase-5 inhibitor therapy - Penile stiffness prior to urination began after starting daily Cialis  5 mg - No improvement in erectile function with current medication regimen  Occupational limitations - Works in [employer] representative - Limited availability for medical appointments due to work schedule  Assessment and Plan Erectile dysfunction Chronic erectile dysfunction with limited response to tadalafil  5 mg. Previous sildenafil trials unsuccessful. Potential structural issues require urology evaluation. Discussed increasing tadalafil  dosage under urology supervision. - Discontinue tadalafil  5 mg daily. - Prescribe tadalafil  10 mg, 1-2 tablets as needed before sexual activity. - Refer to urology for further evaluation and management.

## 2024-09-08 ENCOUNTER — Other Ambulatory Visit (HOSPITAL_COMMUNITY): Payer: Self-pay

## 2024-09-11 NOTE — Progress Notes (Signed)
 09/12/2024 9:54 PM   Spencer Barber 1971/02/18 [id number]  Referring provider: Alvia Selinda PARAS, MD [address] [place],  [place] [address]  Urological history: 1.  Erectile dysfunction - failed sildenafil  - failed tadalafil   - failed ICI   Chief Complaint  Patient presents with   Erectile Dysfunction   HPI: Spencer Barber is a 53 y.o. man who presents today for erectile dysfunction.   Previous records reviewed.  SHIM 8  He does not have confidence that he could get and keep an erection, his erections are not firm enough for penetrative intercourse, he has difficulty maintaining his erections,  and he is not finding intercourse satisfactory for him.  He has not had success with PDE5i's in the past and he had equivocal results with ICI.  Patient is not having spontaneous erections.  He denies any pain or curvature with erections.  He is not able to ejaculate.  He has had issues with ED since 2011.  He also has noticed a decrease in size of his penis.  He and his wife are both in their 36's, but they are still open to having a child.    He is currently taking tadalafil  5 mg daily and feels it may be helping.  He is wanting to have erections without the use of injections.    Testosterone  level pending   Cholesterol (07/2024) 142  Hemoglobin A1c (07/2024) 7.7  PSA (07/2024) 0.6   Tried and failed: PDE 5 inhibitors and ICI   PMH: Past Medical History:  Diagnosis Date   Diabetes mellitus without complication (HCC)    Hyperlipidemia    Hypertension    Interstitial myositis 08/23/2020   Left varicocele 07/06/2014    Surgical History: Past Surgical History:  Procedure Laterality Date   COLONOSCOPY WITH PROPOFOL  N/A 12/06/2020   Procedure: COLONOSCOPY WITH PROPOFOL ;  Surgeon: Unk Corinn Skiff, MD;  Location: [facility] ENDOSCOPY;  Service: Gastroenterology;  Laterality: N/A;   WISDOM TOOTH EXTRACTION      Home Medications:  Allergies as of 09/12/2024    No Known Allergies      Medication List        Accurate as of September 12, 2024 11:59 PM. If you have any questions, ask your nurse or doctor.          Dexcom G7 Sensor Misc Dispense DexCom G7 sensors; Use 1 sensor q 10 days; *Patient is taking insulin   tadalafil  20 MG tablet Commonly known as: CIALIS  Take 1 tablet (20 mg total) by mouth daily as needed for erectile dysfunction. What changed:  medication strength how much to take when to take this   Tresiba FlexTouch 100 UNIT/ML FlexTouch Pen Generic drug: insulin degludec SMARTSIG:0-100 Unit(s) SUB-Q Daily   Trulicity  4.5 MG/0.5ML Soaj Generic drug: Dulaglutide  Inject 4.5 mg into the skin once a week. What changed: Another medication with the same name was removed. Continue taking this medication, and follow the directions you see here.   UltiGuard SafePack Pen Needle 32G X 4 MM Misc Generic drug: Insulin Pen Needle Use to inject insulin once daily as directed.        Allergies: No Known Allergies  Family History: Family History  Problem Relation Age of Onset   Diabetes Mother    Alcohol abuse Father    Colon cancer Maternal Great-grandmother    Thyroid  cancer Neg Hx     Social History:  reports that he has never smoked. He has never used smokeless tobacco. He  reports current alcohol use. He reports that he does not use drugs.  ROS: Pertinent ROS in HPI  Physical Exam: BP 113/75 (BP Location: Left Arm, Patient Position: Sitting, Cuff Size: Large)   Pulse 60   Wt 220 lb (99.8 kg)   SpO2 97%   BMI 28.25 kg/m   Constitutional:  Well nourished. Alert and oriented, No acute distress. HEENT: [place] AT, moist mucus membranes.  Trachea midline Cardiovascular: No clubbing, cyanosis, or edema. Respiratory: Normal respiratory effort, no increased work of breathing. GU: No CVA tenderness.  No bladder fullness or masses.  Patient with circumcised phallus. Urethral meatus is patent.  No penile discharge. No  penile lesions or rashes. A not well formed Peyronie's plaque is noted in the midshaft dorsally.  Scrotum without lesions, cysts, rashes and/or edema.  Testicles are located scrotally bilaterally. No masses are appreciated in the testicles. Left and right epididymis are normal. Neurologic: Grossly intact, no focal deficits, moving all 4 extremities. Psychiatric: Normal mood and affect.  Laboratory Data: See Epic and HPI   I have reviewed the labs.   Pertinent Imaging: N/A  Assessment & Plan:    1. Erectile dysfunction -I explained that conditions like diabetes, hypertension, coronary artery disease, peripheral vascular disease, smoking, alcohol consumption, age, sleep apnea and BPH can diminish the ability to have an erection  -We will obtain a serum testosterone  level at this time; if it is abnormal we will need to repeat the study for confirmation, will also need to consider Clomid if TRT is indicated  -we discussed increasing his tadalafil  to 20 mg daily and following up in one month, he will likely need to progress to ICI or consider an IPP -also advised him to use a vacuum erectaid device for penile rehab  Return in about 1 month (around 10/12/2024) for SHIM .  These notes generated with voice recognition software. I apologize for typographical errors.  Spencer Barber  [facility] [address]  [address] [place], [place] [address] [phone]

## 2024-09-12 ENCOUNTER — Ambulatory Visit (INDEPENDENT_AMBULATORY_CARE_PROVIDER_SITE_OTHER): Admitting: Urology

## 2024-09-12 VITALS — BP 113/75 | HR 60 | Wt 220.0 lb

## 2024-09-12 DIAGNOSIS — N529 Male erectile dysfunction, unspecified: Secondary | ICD-10-CM | POA: Diagnosis not present

## 2024-09-12 MED ORDER — TADALAFIL 20 MG PO TABS: 20.0000 mg | ORAL_TABLET | Freq: Every day | ORAL | 3 refills | Status: AC | PRN

## 2024-09-13 ENCOUNTER — Ambulatory Visit: Payer: Self-pay | Admitting: Urology

## 2024-09-13 ENCOUNTER — Encounter: Payer: Self-pay | Admitting: Urology

## 2024-09-13 LAB — TESTOSTERONE: Testosterone: 376 ng/dL (ref 264–916)

## 2024-09-26 ENCOUNTER — Ambulatory Visit: Admitting: Urology

## 2024-09-26 DIAGNOSIS — I10 Essential (primary) hypertension: Secondary | ICD-10-CM | POA: Diagnosis not present

## 2024-09-26 DIAGNOSIS — E1165 Type 2 diabetes mellitus with hyperglycemia: Secondary | ICD-10-CM | POA: Diagnosis not present

## 2024-09-26 DIAGNOSIS — E785 Hyperlipidemia, unspecified: Secondary | ICD-10-CM | POA: Diagnosis not present

## 2024-09-26 DIAGNOSIS — Z794 Long term (current) use of insulin: Secondary | ICD-10-CM | POA: Diagnosis not present

## 2024-10-16 NOTE — Progress Notes (Signed)
 10/17/2024 10:31 AM   Spencer Barber [birth year] [id number]  Referring provider: Alvia Selinda PARAS, MD [address] [place],  [place] [address]  Urological history: 1.  Erectile dysfunction - testosterone  level (08/2024) 376 - failed sildenafil  - failed tadalafil   - failed ICI   Chief Complaint  Patient presents with   Erectile Dysfunction   HPI: Spencer Barber is a 53 y.o. man who presents today for follow up.    Previous records reviewed.  SHIM 11  He does not have confidence that he could get and keep an erection, his erections are not firm enough for penetrative intercourse, he has difficulty maintaining his erections, and he is not finding intercourse satisfactory for him.  Patient is not having spontaneous erections.  He denies any pain or curvature with erections.  Taking the tadalafil  20 mg daily did not improve his erections.  Testosterone  level (08/2024) 376  Cholesterol (07/2024) 142  Hemoglobin A1c (07/2024) 7.7  PSA (07/2024) 0.6   Tried and failed: PDE 5 inhibitors and ICI   PMH: Past Medical History:  Diagnosis Date   Diabetes mellitus without complication (HCC)    Hyperlipidemia    Hypertension    Interstitial myositis 08/23/2020   Left varicocele 07/06/2014    Surgical History: Past Surgical History:  Procedure Laterality Date   COLONOSCOPY WITH PROPOFOL  N/A 12/06/2020   Procedure: COLONOSCOPY WITH PROPOFOL ;  Surgeon: Unk Corinn Skiff, MD;  Location: [facility] ENDOSCOPY;  Service: Gastroenterology;  Laterality: N/A;   WISDOM TOOTH EXTRACTION      Home Medications:  Allergies as of 10/17/2024   No Known Allergies      Medication List        Accurate as of October 17, 2024 10:31 AM. If you have any questions, ask your nurse or doctor.          Dexcom G7 Sensor Misc Dispense DexCom G7 sensors; Use 1 sensor q 10 days; *Patient is taking insulin   empagliflozin  10 MG Tabs tablet Commonly known as: JARDIANCE  Take 10  mg by mouth daily.   liraglutide 18 MG/3ML Sopn Commonly known as: VICTOZA Inject 1.2 mg into the skin.   Precision QID Test test strip Generic drug: glucose blood Dispense 3 blood glucose test strips, ok to sub any brand preferred by insurance/patient to match with meter, use 3x/day, dx E11.65   tadalafil  20 MG tablet Commonly known as: CIALIS  Take 1 tablet (20 mg total) by mouth daily as needed for erectile dysfunction.   Tresiba FlexTouch 100 UNIT/ML FlexTouch Pen Generic drug: insulin degludec SMARTSIG:0-100 Unit(s) SUB-Q Daily   Trulicity  4.5 MG/0.5ML Soaj Generic drug: Dulaglutide  Inject 4.5 mg into the skin once a week.   UltiGuard SafePack Pen Needle 32G X 4 MM Misc Generic drug: Insulin Pen Needle Use to inject insulin once daily as directed.        Allergies: No Known Allergies  Family History: Family History  Problem Relation Age of Onset   Diabetes Mother    Alcohol abuse Father    Colon cancer Maternal Great-grandmother    Thyroid  cancer Neg Hx     Social History:  reports that he has never smoked. He has never used smokeless tobacco. He reports current alcohol use. He reports that he does not use drugs.  ROS: Pertinent ROS in HPI  Physical Exam: BP 128/82 (BP Location: Left Arm, Patient Position: Sitting, Cuff Size: Large)   Pulse 71   Wt 225 lb (102.1 kg)   SpO2  99%   BMI 28.89 kg/m   Constitutional:  Well nourished. Alert and oriented, No acute distress. HEENT: [place] AT, moist mucus membranes.  Trachea midline Cardiovascular: No clubbing, cyanosis, or edema. Respiratory: Normal respiratory effort, no increased work of breathing. Neurologic: Grossly intact, no focal deficits, moving all 4 extremities. Psychiatric: Normal mood and affect.   Laboratory Data: See Epic and HPI   I have reviewed the labs.   Pertinent Imaging: N/A  Assessment & Plan:    1. Erectile dysfunction - we discussed trying a intra-urethral suppositories, ICI,  vacuum erection devices, Li-SWT, and penile prosthesis implantation - he would like more information on his treatment options, so I gave him QRS codes via MyChart   Return for Patient will call back .  These notes generated with voice recognition software. I apologize for typographical errors.  CLOTILDA HELON RIGGERS  [facility] [address]  [address] [place], [place] [address] [phone]

## 2024-10-17 ENCOUNTER — Encounter: Payer: Self-pay | Admitting: Urology

## 2024-10-17 ENCOUNTER — Ambulatory Visit (INDEPENDENT_AMBULATORY_CARE_PROVIDER_SITE_OTHER): Admitting: Urology

## 2024-10-17 VITALS — BP 128/82 | HR 71 | Wt 225.0 lb

## 2024-10-17 DIAGNOSIS — N529 Male erectile dysfunction, unspecified: Secondary | ICD-10-CM

## 2025-03-13 ENCOUNTER — Ambulatory Visit: Admitting: Endocrinology
